# Patient Record
Sex: Female | Born: 1965 | Race: Black or African American | Hispanic: No | Smoking: Former smoker
Health system: Southern US, Community
[De-identification: ages and names within clinical notes are randomized; demographics above are authoritative.]

## PROBLEM LIST (undated history)

## (undated) DIAGNOSIS — M545 Low back pain, unspecified: Secondary | ICD-10-CM

## (undated) DIAGNOSIS — G4730 Sleep apnea, unspecified: Secondary | ICD-10-CM

## (undated) DIAGNOSIS — M25512 Pain in left shoulder: Secondary | ICD-10-CM

## (undated) DIAGNOSIS — K829 Disease of gallbladder, unspecified: Secondary | ICD-10-CM

## (undated) DIAGNOSIS — D649 Anemia, unspecified: Secondary | ICD-10-CM

## (undated) DIAGNOSIS — I10 Essential (primary) hypertension: Secondary | ICD-10-CM

## (undated) DIAGNOSIS — K5900 Constipation, unspecified: Secondary | ICD-10-CM

## (undated) DIAGNOSIS — Z8719 Personal history of other diseases of the digestive system: Secondary | ICD-10-CM

## (undated) HISTORY — DX: Anemia, unspecified: D64.9

## (undated) HISTORY — DX: Low back pain, unspecified: M54.50

## (undated) HISTORY — DX: Constipation, unspecified: K59.00

## (undated) HISTORY — PX: PANCREAS SURGERY: SHX731

## (undated) HISTORY — DX: Personal history of other diseases of the digestive system: Z87.19

## (undated) HISTORY — DX: Pain in left shoulder: M25.512

## (undated) HISTORY — DX: Low back pain: M54.5

## (undated) HISTORY — DX: Disease of gallbladder, unspecified: K82.9

## (undated) HISTORY — PX: CHOLECYSTECTOMY: SHX55

## (undated) HISTORY — PX: ABDOMINAL HYSTERECTOMY: SHX81

---

## 2010-08-01 ENCOUNTER — Emergency Department (HOSPITAL_COMMUNITY): Payer: Self-pay

## 2010-08-01 ENCOUNTER — Encounter (HOSPITAL_COMMUNITY): Payer: Self-pay

## 2010-08-01 ENCOUNTER — Inpatient Hospital Stay (HOSPITAL_COMMUNITY)
Admission: EM | Admit: 2010-08-01 | Discharge: 2010-08-06 | DRG: 438 | Disposition: A | Discharge status: Home / Self Care | Payer: Self-pay | Attending: Internal Medicine | Admitting: Internal Medicine

## 2010-08-01 DIAGNOSIS — K859 Acute pancreatitis without necrosis or infection, unspecified: Principal | ICD-10-CM | POA: Diagnosis present

## 2010-08-01 DIAGNOSIS — K8050 Calculus of bile duct without cholangitis or cholecystitis without obstruction: Secondary | ICD-10-CM | POA: Diagnosis present

## 2010-08-01 DIAGNOSIS — E860 Dehydration: Secondary | ICD-10-CM | POA: Diagnosis present

## 2010-08-01 DIAGNOSIS — J189 Pneumonia, unspecified organism: Secondary | ICD-10-CM | POA: Diagnosis present

## 2010-08-01 DIAGNOSIS — Z9079 Acquired absence of other genital organ(s): Secondary | ICD-10-CM

## 2010-08-01 DIAGNOSIS — Z6841 Body Mass Index (BMI) 40.0 and over, adult: Secondary | ICD-10-CM

## 2010-08-01 DIAGNOSIS — I10 Essential (primary) hypertension: Secondary | ICD-10-CM | POA: Diagnosis present

## 2010-08-01 DIAGNOSIS — Z79899 Other long term (current) drug therapy: Secondary | ICD-10-CM

## 2010-08-01 DIAGNOSIS — Z9089 Acquired absence of other organs: Secondary | ICD-10-CM

## 2010-08-01 HISTORY — DX: Essential (primary) hypertension: I10

## 2010-08-01 LAB — URINALYSIS, ROUTINE W REFLEX MICROSCOPIC
Glucose, UA: NEGATIVE mg/dL
Specific Gravity, Urine: 1.01 (ref 1.005–1.030)
pH: 6 (ref 5.0–8.0)

## 2010-08-01 LAB — COMPREHENSIVE METABOLIC PANEL
AST: 255 U/L — ABNORMAL HIGH (ref 0–37)
AST: 253 U/L — ABNORMAL HIGH (ref 0–37)
Albumin: 4 g/dL (ref 3.5–5.2)
Albumin: 3.9 g/dL (ref 3.5–5.2)
Alkaline Phosphatase: 407 U/L — ABNORMAL HIGH (ref 39–117)
BUN: 13 mg/dL (ref 6–23)
BUN: 13 mg/dL (ref 6–23)
Chloride: 101 mEq/L (ref 96–112)
Creatinine, Ser: 1.01 mg/dL (ref 0.50–1.10)
Potassium: 3 mEq/L — ABNORMAL LOW (ref 3.5–5.1)
Potassium: 3.2 mEq/L — ABNORMAL LOW (ref 3.5–5.1)
Sodium: 140 mEq/L (ref 135–145)
Total Protein: 8.3 g/dL (ref 6.0–8.3)
Total Protein: 8.2 g/dL (ref 6.0–8.3)

## 2010-08-01 LAB — DIFFERENTIAL
Eosinophils Absolute: 0 10*3/uL (ref 0.0–0.7)
Lymphs Abs: 1.7 10*3/uL (ref 0.7–4.0)
Monocytes Absolute: 0.9 10*3/uL (ref 0.1–1.0)
Monocytes Relative: 7 % (ref 3–12)
Neutrophils Relative %: 80 % — ABNORMAL HIGH (ref 43–77)

## 2010-08-01 LAB — CK TOTAL AND CKMB (NOT AT ARMC)
CK, MB: 2.6 ng/mL (ref 0.3–4.0)
Relative Index: 2 (ref 0.0–2.5)

## 2010-08-01 LAB — CBC
HCT: 42.6 % (ref 36.0–46.0)
MCHC: 31.5 g/dL (ref 30.0–36.0)
Platelets: 209 10*3/uL (ref 150–400)
RDW: 14.4 % (ref 11.5–15.5)
WBC: 12.8 10*3/uL — ABNORMAL HIGH (ref 4.0–10.5)

## 2010-08-01 LAB — PROTIME-INR
INR: 0.92 (ref 0.00–1.49)
Prothrombin Time: 12.6 seconds (ref 11.6–15.2)

## 2010-08-01 LAB — URINE MICROSCOPIC-ADD ON

## 2010-08-01 LAB — LIPASE, BLOOD: Lipase: >3000 U/L — ABNORMAL HIGH (ref 11–59)

## 2010-08-01 LAB — TROPONIN I: Troponin I: <0.3 ng/mL (ref <0.30)

## 2010-08-01 LAB — CARDIAC PANEL(CRET KIN+CKTOT+MB+TROPI): Troponin I: <0.3 ng/mL (ref <0.30)

## 2010-08-01 MED ORDER — IOHEXOL 350 MG/ML SOLN
125.0000 mL | Freq: Once | INTRAVENOUS | Status: AC | PRN
  Administered 2010-08-01: 125 mL via INTRAVENOUS

## 2010-08-01 MED ORDER — ONDANSETRON HCL 4 MG/2ML IJ SOLN
INTRAMUSCULAR | Status: AC
  Filled 2010-08-01: qty 2

## 2010-08-01 MED ORDER — GI COCKTAIL ~~LOC~~
ORAL | Status: AC
  Filled 2010-08-01: qty 30

## 2010-08-02 ENCOUNTER — Inpatient Hospital Stay (HOSPITAL_COMMUNITY): Payer: Self-pay

## 2010-08-02 DIAGNOSIS — K859 Acute pancreatitis without necrosis or infection, unspecified: Secondary | ICD-10-CM

## 2010-08-02 DIAGNOSIS — K8050 Calculus of bile duct without cholangitis or cholecystitis without obstruction: Secondary | ICD-10-CM

## 2010-08-02 LAB — GLUCOSE, CAPILLARY
Glucose-Capillary: 78 mg/dL (ref 70–99)
Glucose-Capillary: 94 mg/dL (ref 70–99)
Glucose-Capillary: 113 mg/dL — ABNORMAL HIGH (ref 70–99)

## 2010-08-02 LAB — LIPID PANEL
HDL: 55 mg/dL (ref >39)
LDL Cholesterol: 105 mg/dL — ABNORMAL HIGH (ref 0–99)
Total CHOL/HDL Ratio: 3.1 RATIO
Triglycerides: 56 mg/dL (ref <150)
VLDL: 11 mg/dL (ref 0–40)

## 2010-08-02 LAB — HEPATIC FUNCTION PANEL
AST: 219 U/L — ABNORMAL HIGH (ref 0–37)
Albumin: 3.3 g/dL — ABNORMAL LOW (ref 3.5–5.2)
Alkaline Phosphatase: 397 U/L — ABNORMAL HIGH (ref 39–117)
Total Protein: 7.4 g/dL (ref 6.0–8.3)

## 2010-08-02 LAB — CBC
HCT: 39.8 % (ref 36.0–46.0)
MCHC: 31.2 g/dL (ref 30.0–36.0)
RDW: 14.7 % (ref 11.5–15.5)

## 2010-08-02 LAB — PTH, INTACT AND CALCIUM: Calcium, Total (PTH): 9.1 mg/dL (ref 8.4–10.5)

## 2010-08-02 LAB — DIFFERENTIAL
Basophils Absolute: 0 10*3/uL (ref 0.0–0.1)
Eosinophils Relative: 0 % (ref 0–5)
Lymphocytes Relative: 22 % (ref 12–46)
Monocytes Absolute: 0.6 10*3/uL (ref 0.1–1.0)

## 2010-08-02 LAB — BASIC METABOLIC PANEL
BUN: 13 mg/dL (ref 6–23)
CO2: 31 mEq/L (ref 19–32)
Glucose, Bld: 102 mg/dL — ABNORMAL HIGH (ref 70–99)
Potassium: 3.7 mEq/L (ref 3.5–5.1)
Sodium: 141 mEq/L (ref 135–145)

## 2010-08-02 LAB — CARDIAC PANEL(CRET KIN+CKTOT+MB+TROPI)
Relative Index: 1.4 (ref 0.0–2.5)
Total CK: 267 U/L — ABNORMAL HIGH (ref 7–177)
Troponin I: <0.3 ng/mL (ref <0.30)

## 2010-08-02 LAB — URINE CULTURE: Culture  Setup Time: 201208010127

## 2010-08-02 LAB — LIPASE, BLOOD: Lipase: 945 U/L — ABNORMAL HIGH (ref 11–59)

## 2010-08-02 MED ORDER — GADOBENATE DIMEGLUMINE 529 MG/ML IV SOLN
20.0000 mL | Freq: Once | INTRAVENOUS | Status: AC | PRN
  Administered 2010-08-02: 20 mL via INTRAVENOUS

## 2010-08-03 DIAGNOSIS — K8050 Calculus of bile duct without cholangitis or cholecystitis without obstruction: Secondary | ICD-10-CM

## 2010-08-03 DIAGNOSIS — K859 Acute pancreatitis without necrosis or infection, unspecified: Secondary | ICD-10-CM

## 2010-08-03 LAB — DIFFERENTIAL
Eosinophils Absolute: 0 10*3/uL (ref 0.0–0.7)
Eosinophils Relative: 0 % (ref 0–5)
Lymphs Abs: 2.1 10*3/uL (ref 0.7–4.0)

## 2010-08-03 LAB — GLUCOSE, CAPILLARY
Glucose-Capillary: 100 mg/dL — ABNORMAL HIGH (ref 70–99)
Glucose-Capillary: 84 mg/dL (ref 70–99)
Glucose-Capillary: 90 mg/dL (ref 70–99)

## 2010-08-03 LAB — BASIC METABOLIC PANEL
BUN: 9 mg/dL (ref 6–23)
Chloride: 102 mEq/L (ref 96–112)
GFR calc Af Amer: >60 mL/min (ref >60)
Potassium: 4.1 mEq/L (ref 3.5–5.1)

## 2010-08-03 LAB — CBC
MCV: 77.3 fL — ABNORMAL LOW (ref 78.0–100.0)
Platelets: 153 10*3/uL (ref 150–400)
RDW: 15 % (ref 11.5–15.5)
WBC: 10.7 10*3/uL — ABNORMAL HIGH (ref 4.0–10.5)

## 2010-08-03 LAB — HEPATIC FUNCTION PANEL
AST: 78 U/L — ABNORMAL HIGH (ref 0–37)
Bilirubin, Direct: 0.7 mg/dL — ABNORMAL HIGH (ref 0.0–0.3)
Indirect Bilirubin: 1.4 mg/dL — ABNORMAL HIGH (ref 0.3–0.9)

## 2010-08-03 LAB — MAGNESIUM: Magnesium: 2.1 mg/dL (ref 1.5–2.5)

## 2010-08-03 LAB — PHOSPHORUS: Phosphorus: 3.1 mg/dL (ref 2.3–4.6)

## 2010-08-04 ENCOUNTER — Inpatient Hospital Stay (HOSPITAL_COMMUNITY): Payer: Self-pay

## 2010-08-04 LAB — HEPATIC FUNCTION PANEL
ALT: 146 U/L — ABNORMAL HIGH (ref 0–35)
AST: 31 U/L (ref 0–37)
Bilirubin, Direct: 0.3 mg/dL (ref 0.0–0.3)
Indirect Bilirubin: 0.8 mg/dL (ref 0.3–0.9)
Total Bilirubin: 1.1 mg/dL (ref 0.3–1.2)

## 2010-08-04 LAB — GLUCOSE, CAPILLARY: Glucose-Capillary: 90 mg/dL (ref 70–99)

## 2010-08-05 LAB — COMPREHENSIVE METABOLIC PANEL
ALT: 112 U/L — ABNORMAL HIGH (ref 0–35)
Alkaline Phosphatase: 437 U/L — ABNORMAL HIGH (ref 39–117)
CO2: 30 mEq/L (ref 19–32)
Chloride: 103 mEq/L (ref 96–112)
GFR calc Af Amer: >60 mL/min (ref >60)
Glucose, Bld: 91 mg/dL (ref 70–99)
Potassium: 3.7 mEq/L (ref 3.5–5.1)
Sodium: 139 mEq/L (ref 135–145)
Total Bilirubin: 1.5 mg/dL — ABNORMAL HIGH (ref 0.3–1.2)
Total Protein: 6.9 g/dL (ref 6.0–8.3)

## 2010-08-05 LAB — CBC
Hemoglobin: 11.6 g/dL — ABNORMAL LOW (ref 12.0–15.0)
RBC: 4.75 MIL/uL (ref 3.87–5.11)
WBC: 8 10*3/uL (ref 4.0–10.5)

## 2010-08-06 DIAGNOSIS — K8050 Calculus of bile duct without cholangitis or cholecystitis without obstruction: Secondary | ICD-10-CM

## 2010-08-06 DIAGNOSIS — K859 Acute pancreatitis without necrosis or infection, unspecified: Secondary | ICD-10-CM

## 2010-08-06 LAB — HEPATIC FUNCTION PANEL
AST: 41 U/L — ABNORMAL HIGH (ref 0–37)
Bilirubin, Direct: 0.2 mg/dL (ref 0.0–0.3)
Total Bilirubin: 0.6 mg/dL (ref 0.3–1.2)

## 2010-08-16 NOTE — Discharge Summary (Signed)
[name], [name] NO.:  [id number]  MEDICAL RECORD NO.:  [id number]  LOCATION:  1419                         FACILITY:  [facility]  PHYSICIAN:  Jeoffrey Massed, MD    DATE OF BIRTH:  [birth year]  DATE OF ADMISSION:  08/01/2010 DATE OF DISCHARGE:                        DISCHARGE SUMMARY - REFERRING   PRIMARY CARE PRACTITIONER:  Newman Pies, MD at [facility].  PRIMARY DISCHARGE DIAGNOSES: 1. Gallstone pancreatitis. 2. Possible pneumonia. 3. Choledocholithiasis status post ERCP.  SECONDARY DISCHARGE DIAGNOSES/PAST MEDICAL HISTORY:  Significant for; 1. Hypertension. 2. History of urgent cholecystectomy around in 2002 while the patient     was in [place].  DISCHARGE MEDICATIONS:  Include the following; 1. Milk of magnesia 30 mL p.o. daily p.r.n. 2. Zofran 4 mg p.o. 6 hours p.r.n. 3. Oxycodone 5 mg 1 tablet q.6 h. p.r.n. for intractable pain. 4. Protonix 40 mg p.o. daily. 5. Lisinopril/hydrochlorothiazide 20/25 one tablet p.o. daily.  CONSULTANTS ON THE CASE:  Iva Boop, MD, [facility].  BRIEF HISTORY OF PRESENT ILLNESS:  The patient is a 45 year old female who has a history of hypertension and who underwent a urgent cholecystectomy in 2002, presented with abdominal pain.  She was found to have elevated lipase and elevated liver function tests and was then admitted to the hospitalist service for the evaluation and treatment. For further details, please see the history and physical that was dictated by Dr. Toniann Fail on admission.  PERTINENT RADIOLOGICAL STUDIES: 1. CT angiogram of the chest showed no evidence of thoracic aortic     dissection.  Mild cardiomegaly.  Bilateral lower lobe infiltrate     suspicious for pneumonia. 2. CTA of the abdomen and pelvis showed no evidence for abdominal     aortic iliac artery dissection.  Mild-to-moderate acute     pancreatitis.  No evidence of pancreatic mass or diverticulosis. 3. MRCP of the  abdomen showed mild intra and extrahepatic biliary duct     dilatation with an apparent 8 x 12 mm distal common duct stone,     just about at the level of the ampulla.  Edema in and around the     pancreatic head and body.  Imaging features are consistent with     pancreatitis.  There is no evidence of pseudocyst or abscess at     this point in time.  BRIEF HOSPITAL COURSE: 1. Pancreatitis.  The patient had clinical as well as laboratory     evidence of pancreatitis.  On initial presentation, her liver     function tests were grossly abnormal, which included an elevated     alkaline phosphatase along with elevated bilirubin levels.  Her     transaminases were also elevated.  Given her clinical features, it     was highly suspicious that she did in fact have choledocholithiasis     that was causing her to have pancreatitis.  MRCP was done, which     did confirm intrahepatic and extrahepatic biliary ductal dilatation     and also showed a stone that was lodged in the CBD.  Please see     above for further details.  GI was then consulted and the  patient     underwent an ERCP with sphincterotomy and removal of stones done on     August 3rd.  Following this, her pain has significantly resolved.     Patient is now able to eat a regular diet.  GI has seen her and     cleared her for discharge as well.  They recommended checking a     repeat LFT in around 1 week time with her primary care     practitioner, all of this was explained to the patient in detail by     me personally.  She is to follow up with [facility] as needed.  Dr.     Leone Payor has left the patient, his contact information as well. 2. Hypertension.  The patient, initially on first few days of     admission, did not even require any antihypertensive medication as     the blood pressure was controlled on own; however, with symptomatic     improvement, her blood pressure slowly started to creep up and     today it has been in the  170s to 180s range.  She has received all     of her usual blood pressure medications and now her last BP was     154/95.  The patient did have some mild headaches early; however,     that has also resolved.  DISPOSITION:  It was thought that the patient is stable for discharge home.  FOLLOWUP INSTRUCTIONS:  The patient to follow up with her primary care practitioner within 1 week for repeat LFTs.  She is to call and make an appointment.  She claims understanding.  She is to follow up with Dr. Stan Head of [facility] as needed.  DIET:  The patient should stay on a low-fat diet for the next few weeks as well.  Total time spent for discharge 45 minutes.     Jeoffrey Massed, MD     SG/MEDQ  D:  08/06/2010  T:  08/06/2010  Job:  [id number]  cc:   Newman Pies, MD Fax: [phone]  Electronically Signed by Jeoffrey Massed  on 08/16/2010 05:33:40 PM

## 2010-09-05 NOTE — H&P (Signed)
NAME:  [name], RAHE NO.:  [id number]  MEDICAL RECORD NO.:  [id number]  LOCATION:  WLED                         FACILITY:  [facility]  PHYSICIAN:  Eduard Clos, MDDATE OF BIRTH:  1965-02-17  DATE OF ADMISSION:  08/01/2010 DATE OF DISCHARGE:                             HISTORY & PHYSICAL   PRIMARY CARE PHYSICIAN:  Newman Pies, MD, [facility]  CHIEF COMPLAINT:  Abdominal pain.  HISTORY OF PRESENT ILLNESS:  45 year old female with history of hypertension who has been experiencing sudden onset of abdominal pain since morning today after which the patient developed multiple episodes of nausea, vomiting, and also eventually had a near-syncopal like spell and had multiple episodes of diarrhea.  Denies any blood in the vomiting or in the diarrhea.  The patient had a persistent pain.  The pain was mainly in the epigastric area which was radiating to her back.  The patient was brought to the ER.  In the ER, the patient had a CT chest, abdomen and pelvis to rule out any dissection.  It was showing acute pancreatitis.  In addition, the patient's labs also showed increased LFTs with increased lipase.  At this time, the patient has been admitted for acute pancreatitis.  The patient states that along with the abdominal pain, the patient also developed some mild shortness of breath with chest pain, which at this time has resolved.  Denies any headache or visual symptoms, any focal deficit.  Denies any dysuria or discharges.  PAST MEDICAL HISTORY:  History of hypertension.  PAST SURGICAL HISTORY:  Cholecystectomy and hysterectomy.  MEDICATIONS PRIOR TO ADMISSION:  Lisinopril with hydrochlorothiazide 20/25 mg p.o. daily.  ALLERGIES:  No known drug allergies.  FAMILY HISTORY:  Positive for grandmother having lung cancer.  SOCIAL HISTORY:  The patient quit smoking in 1999.  Also quit drinking in 1999; used to drink very occasionally.  Denies any drug abuse.   Lives with her mom.  Works for the city of [place].  She is in the [employer].  REVIEW OF SYSTEMS:  As per the history of present illness, nothing else is significant.  PHYSICAL EXAMINATION:  GENERAL:  The patient examined bedside, not in acute distress. VITAL SIGNS:  Blood pressure 122/60, pulse is 58 per minute, temperature 97.7, respirations 20 per minute, O2 sat is 96% on 2 L.  HEENT: Anicteric.  No pallor.  No discharge from ears, eyes, nose or mouth. CHEST:  Bilateral air entry present.  No rhonchi.  No crepitation. HEART:  S1 and S2 heard. ABDOMEN:  Soft, nontender.  Bowel sounds heard.  No discoloration.  No guarding or rigidity. NEUROLOGIC:  The patient is alert, awake and oriented to time, place and person. EXTREMITIES:  Both upper and lower extremities 5/5.  Peripheral pulses felt.  No edema.  IMAGING STUDIES: 1. Chest x-ray shows low lung volumes with patchy coarse bilateral     lower lobe airspace opacities. 2. CT angiography of the chest shows no evidence of thoracic aortic     dissection.  Bilateral lower lobe infiltrates suspicious for     pneumonia.  Mild cardiomegaly. 3. CT of the abdomen and pelvis, angio protocol, shows no evidence of  abdominal aortic or iliac artery dissection, mild-to-moderate acute     pancreatitis.  No evidence of pancreatic mass or pseudocyst.     Diverticulosis; no radiographic evidence of diverticulitis.  Small     periumbilical hernia containing only fat.  LABORATORY DATA:  CBC:  WBC is 12.8, hemoglobin is 13.4, hematocrit is 42.6, platelets 209.  PT/INR is 12.6 and 0.92, D-dimer is 0.7.  Complete metabolic panel:  Sodium 139, potassium 3, chloride 100, carbon dioxide 31, glucose 109, BUN 13, creatinine 1.  Total bilirubin 1.8, alkaline phosphatase 398, AST 253, ALT 427, total protein 8.2, albumin 3.9, calcium 10.3, lipase more than 3000.  CK 131, MB is 2.6, relative index 2, troponin less than 0.3.  UA is negative for  nitrites, trace leukocytes, some rare WBCs 3-6, bacteria few.  ASSESSMENT: 1. Acute pancreatitis. 2. Possible pneumonia. 3. Dehydration. 4. Mild hypercalcemia. 5. History of hypertension. 6. Abnormal liver function tests.  PLAN: 1. At this time, we will admit the patient to telemetry. 2. For her acute pancreatitis, at this time I am going to keep the     patient n.p.o., hydrate the patient with normal saline, and pain     relief medications.  Repeat her labs again in the a.m. including     BMET, LFTs, CBC and lipase levels.  At this time, we do not know     the exact cause for her pancreatitis.  The patient did have a     previous cholecystectomy but her LFTs are slightly high at this     time including the alkaline phosphatase.  I am going to do an MRCP     to make sure there is no definite obstruction.  CAT scan does not     show anything at this time.  We should closely follow the LFTs.  I     am also going to check a fasting lipid panel to particularly look     into triglyceride level.  The patient only occasionally drinks     alcohol and she states she has not drunk for a long time now. 3. Bilateral air space disease per the CT angio chest.  At this time,     the patient does not have any cough or phlegm but she did throw up     multiple times.  At this time, I will keep the patient empirically     on Levaquin.  I am going to repeat a chest x-ray again in the a.m.     particularly to make sure the patient is not developing ARDS.  The     patient at this time is not in any acute distress.  The patient's     urine also shows possibility of UTI for which I am going to get     urine cultures.  The patient's Levaquin will also cover for     possibility of UTI. 4. Dehydration and hypercalcemia.  I think the patient's hypercalcemia     probably is from her dehydration and also the patient has been     using hydrochlorothiazide.  I think her hypercalcemia will improve     with  hydration and holding off her hydrochlorothiazide.  I am also     going to get levels of serum parathyroid levels, vitamin D and     phosphorus levels. 5. I am also going to check an acute hepatitis panel given her     increased LFTs.  We will also cycle cardiac  markers, get an EKG,     and as advised earlier, repeat a portable chest x-ray in the a.m. 6. Further recommendations as condition evolves.     Eduard Clos, MD     ANK/MEDQ  D:  08/01/2010  T:  08/01/2010  Job:  [id number]  cc:   Newman Pies, MD Fax: [phone]  Electronically Signed by Midge Minium MD on 09/05/2010 09:00:50 AM

## 2012-11-06 ENCOUNTER — Other Ambulatory Visit: Payer: Self-pay | Admitting: Nurse Practitioner

## 2012-11-06 DIAGNOSIS — R1900 Intra-abdominal and pelvic swelling, mass and lump, unspecified site: Secondary | ICD-10-CM

## 2012-11-11 ENCOUNTER — Ambulatory Visit
Admission: RE | Admit: 2012-11-11 | Discharge: 2012-11-11 | Disposition: A | Discharge status: Home / Self Care | Payer: BC Managed Care – PPO | Source: Ambulatory Visit | Attending: Nurse Practitioner | Admitting: Nurse Practitioner

## 2012-11-11 DIAGNOSIS — R1900 Intra-abdominal and pelvic swelling, mass and lump, unspecified site: Secondary | ICD-10-CM

## 2014-02-04 ENCOUNTER — Emergency Department (HOSPITAL_COMMUNITY)
Admission: EM | Admit: 2014-02-04 | Discharge: 2014-02-04 | Disposition: A | Discharge status: Home / Self Care | Payer: Self-pay | Attending: Emergency Medicine | Admitting: Emergency Medicine

## 2014-02-04 ENCOUNTER — Emergency Department (HOSPITAL_COMMUNITY): Payer: Self-pay

## 2014-02-04 ENCOUNTER — Encounter (HOSPITAL_COMMUNITY): Payer: Self-pay | Admitting: Emergency Medicine

## 2014-02-04 DIAGNOSIS — I10 Essential (primary) hypertension: Secondary | ICD-10-CM | POA: Insufficient documentation

## 2014-02-04 DIAGNOSIS — E669 Obesity, unspecified: Secondary | ICD-10-CM | POA: Insufficient documentation

## 2014-02-04 DIAGNOSIS — Y9289 Other specified places as the place of occurrence of the external cause: Secondary | ICD-10-CM | POA: Insufficient documentation

## 2014-02-04 DIAGNOSIS — Y998 Other external cause status: Secondary | ICD-10-CM | POA: Insufficient documentation

## 2014-02-04 DIAGNOSIS — W102XXA Fall (on)(from) incline, initial encounter: Secondary | ICD-10-CM

## 2014-02-04 DIAGNOSIS — S8991XA Unspecified injury of right lower leg, initial encounter: Secondary | ICD-10-CM | POA: Insufficient documentation

## 2014-02-04 DIAGNOSIS — Y9389 Activity, other specified: Secondary | ICD-10-CM | POA: Insufficient documentation

## 2014-02-04 DIAGNOSIS — W010XXA Fall on same level from slipping, tripping and stumbling without subsequent striking against object, initial encounter: Secondary | ICD-10-CM | POA: Insufficient documentation

## 2014-02-04 MED ORDER — HYDROCODONE-ACETAMINOPHEN 5-325 MG PO TABS: 1.0000 | ORAL_TABLET | ORAL | Status: DC | PRN

## 2014-02-04 NOTE — ED Notes (Signed)
Per EMS: Pt tripped off curb at [employer].  Twisted rt knee.  Pt states she braced herself on elbows when she fell.  Rt lateral knee pain.  Pt was able to stand and pivot onto EMS stretcher.  Pulses present.

## 2014-02-04 NOTE — ED Provider Notes (Signed)
CSN: [id number]     Arrival date & time 02/04/14  1246 History  This chart was scribed for non-physician practitioner, Quincy Carnes, PA-C and Verna Czech, PA-student working with Jasper Riling. Alvino Chapel, MD by Einar Pheasant, ED scribe. This patient was seen in room WTR8/WTR8 and the patient's care was started at 12:52 PM.    Chief Complaint  Patient presents with  . Fall  . Knee Pain   The history is provided by the patient and medical records. No language interpreter was used.   HPI Comments: [name] is a 49 y.o. Female with a PMhx of HTN was brought in by ambulance, she presents to the Emergency Department complaining of a Fall that occurred just prior to arrival, approximately 1 hours ago. Pt states that she was walking out of [employer] when she missed a step and felt her right knee pop. She does admit twisting her right ankle but she states that the pain is localized more to her knee. Pain made worse by bearing weight and movement. She currently rates her pain as "7/10". No hx of prior knee problems. Denies tobacco usage or illicit drug use. Pt denies fever, neck pain, sore throat, visual disturbance, CP, cough, SOB, abdominal pain, nausea, emesis, diarrhea, urinary symptoms, back pain, HA, weakness, numbness and rash as associated symptoms.    Past Medical History  Diagnosis Date  . Hypertension    No past surgical history on file. No family history on file. History  Substance Use Topics  . Smoking status: Not on file  . Smokeless tobacco: Not on file  . Alcohol Use: Not on file   OB History    No data available     Review of Systems  Constitutional: Negative for fever.  Musculoskeletal: Positive for joint swelling and arthralgias. Negative for myalgias.  Neurological: Negative for weakness and numbness.   Allergies  Contrast media  Home Medications   Prior to Admission medications   Not on File   BP 131/78 mmHg  Pulse 75  Temp(Src) 98.1 F (36.7 C) (Oral)  Resp  18  SpO2 99%   Physical Exam  Constitutional: She is oriented to person, place, and time. She appears well-developed and well-nourished.  HENT:  Head: Normocephalic and atraumatic.  Mouth/Throat: Oropharynx is clear and moist.  Eyes: Conjunctivae and EOM are normal. Pupils are equal, round, and reactive to light.  Neck: Normal range of motion.  Cardiovascular: Normal rate, regular rhythm and normal heart sounds.   Pulmonary/Chest: Effort normal and breath sounds normal.  Abdominal: Soft. Bowel sounds are normal.  Musculoskeletal:       Right knee: She exhibits decreased range of motion and swelling (mild). Tenderness found. Lateral joint line tenderness noted.  Anterior and posterior drawer test negative. Positive Valgus. Negative Varus.  Leg remains NVI  Neurological: She is alert and oriented to person, place, and time.  Skin: Skin is warm and dry.  Psychiatric: She has a normal mood and affect.  Nursing note and vitals reviewed.   ED Course  Procedures (including critical care time)  DIAGNOSTIC STUDIES: Oxygen Saturation is 99% on RA, normal by my interpretation.    COORDINATION OF CARE: 1:10 PM- Pt advised of plan for treatment and pt agrees.  Imaging Review Dg Knee Complete 4 Views Right  02/04/2014   CLINICAL DATA:  Status post fall today with a right knee injury. The patient heard a pop. Initial encounter.  EXAM: RIGHT KNEE - COMPLETE 4+ VIEW  COMPARISON:  None.  FINDINGS: No acute bony or joint abnormality. No acute bony or joint abnormality is identified. Joint spaces are preserved with an osteophyte seen off the superior pole of the patella. Mild spurring at the quadriceps tendon insertion is noted.  IMPRESSION: Negative exam.   Electronically Signed   By: Inge Rise M.D.   On: 02/04/2014 14:14   MDM   Final diagnoses:  Right knee injury, initial encounter  Fall (on)(from) incline, initial encounter   49 year old female with trip and fall off curb it is obese.  She twisted her right knee. No head injury or loss of consciousness. She complains of lateral right knee pain. She has been able to bear weight and perform limited movements.  X-ray was obtained which is negative for acute findings. Patient does have positive valgus test and complains of pain "in the inside of her knee". Concern for possible internal derangement of knee. Patient placed in knee immobilizer and crutches given. She was started on Vicodin for pain control. She will follow-up with orthopedics.  I personally performed the services described in this documentation, which was scribed in my presence. The recorded information has been reviewed and is accurate.  Larene Pickett, PA-C 02/04/14 [place] Alvino Chapel, MD 02/08/14 [phone]

## 2014-02-04 NOTE — Discharge Instructions (Signed)
Take the prescribed medication as directed. Follow-up with Dr. Mardelle Matte for further evaluation of knee injury-- call to schedule appt. Return to the ED for new or worsening symptoms.

## 2014-02-04 NOTE — ED Notes (Signed)
Pt stated that she stepped off a curb, twisting r/leg. C/o r/knee pain. Pt did not strike head

## 2016-01-26 DIAGNOSIS — I10 Essential (primary) hypertension: Secondary | ICD-10-CM | POA: Diagnosis not present

## 2016-01-26 DIAGNOSIS — J019 Acute sinusitis, unspecified: Secondary | ICD-10-CM | POA: Diagnosis not present

## 2016-01-26 DIAGNOSIS — J302 Other seasonal allergic rhinitis: Secondary | ICD-10-CM | POA: Diagnosis not present

## 2016-06-26 DIAGNOSIS — I10 Essential (primary) hypertension: Secondary | ICD-10-CM | POA: Diagnosis not present

## 2016-06-26 DIAGNOSIS — J301 Allergic rhinitis due to pollen: Secondary | ICD-10-CM | POA: Diagnosis not present

## 2016-07-09 DIAGNOSIS — Z1231 Encounter for screening mammogram for malignant neoplasm of breast: Secondary | ICD-10-CM | POA: Diagnosis not present

## 2016-07-10 DIAGNOSIS — J301 Allergic rhinitis due to pollen: Secondary | ICD-10-CM | POA: Diagnosis not present

## 2016-07-10 DIAGNOSIS — I10 Essential (primary) hypertension: Secondary | ICD-10-CM | POA: Diagnosis not present

## 2016-08-09 DIAGNOSIS — I10 Essential (primary) hypertension: Secondary | ICD-10-CM | POA: Diagnosis not present

## 2016-08-21 DIAGNOSIS — H40033 Anatomical narrow angle, bilateral: Secondary | ICD-10-CM | POA: Diagnosis not present

## 2016-09-20 DIAGNOSIS — Z1211 Encounter for screening for malignant neoplasm of colon: Secondary | ICD-10-CM | POA: Diagnosis not present

## 2016-09-21 ENCOUNTER — Encounter: Payer: Self-pay | Admitting: Registered"

## 2016-09-21 ENCOUNTER — Encounter: Payer: Self-pay | Attending: Family Medicine | Admitting: Registered"

## 2016-09-21 DIAGNOSIS — Z713 Dietary counseling and surveillance: Secondary | ICD-10-CM | POA: Insufficient documentation

## 2016-09-21 DIAGNOSIS — I10 Essential (primary) hypertension: Secondary | ICD-10-CM | POA: Insufficient documentation

## 2016-09-21 NOTE — Progress Notes (Signed)
Medical Nutrition Therapy:  Appt start time: 0925 end time:  1025.   Assessment:  Primary concerns today: Pt referred for weight management and hypertension. Pt says she wants to lose weight. She says she would like to get under 200 lb. Pt says she has lost 22 lb on the Keto diet over the past two months. Pt says she would also like to be able to get her hypertension under control so she would not have to take medication for it, but her doctor told her it is unlikely she would be able to discontinue medications as HTN runs in her family. Pt is currently only eating meats, vegetables, and recently added in fruit per recommendations from her doctor. Pt says she misses being able to eat potatoes and sub sandwiches, but she does like that she is now eating less processed foods. Pt works for the [employer]. Pt says she struggles with getting in physical activity because by the time she gets home from work she does not feel like exercising. 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   Noted labs:   06/26/16:  HbA1c: 5.8 HDL Cholesterol: 47 mg/dL  Preferred Learning Style:  No preference indicated   Learning Readiness:   Ready  MEDICATIONS: See list.    DIETARY INTAKE:  Usual eating pattern includes 3 meals and 1-2 snacks per day. Pt says she often will eat either nuts or a low carbohydrate ice cream as a snack. Meals at home are often eaten in her bedroom with electronics present.   Everyday foods include eggs, salad, smoothies. Avoided foods include pork. Pt currently only eats meats, vegetables, and fruits.     24-hr recall:  B ( AM): coffee with flavored creamer, 3 eggs with cheese, [place] bacon   Snk ( AM): None reported.  L ( PM): smoothie with spinach, avocado, strawberries, blueberries, and watermelon Snk ( PM): honey glazed almonds  D ( PM): chicken fajita with onions, peppers, salsa, cheese, lettuce, guacamole, and sour  cream, low carb ice cream dessert ([employer] Smart Carb ice cream bar)  Snk ( PM): None reported.  Beverages: coffee, water   Usual physical activity: None. Has a gym membership but has not went yet due to schedule.   Progress Towards Goal(s):  In progress.   Nutritional Diagnosis:  NI-5.11.1 Predicted suboptimal nutrient intake As related to avoidance of grains/starches .  As evidenced by pt's reported dietary recall and habits (pt reports following keto diet).    Intervention:  Nutrition counseling provided. Dietitian provided education regarding balanced nutrition. Discussed how dieting affects metabolism and how cutting out whole food groups can lead to a nutritional deficiency/is not advised. Encouraged pt to include some whole grains or starchy vegetables at each meal following the My Plate recommendation. Discussed including some protein if having a carbohydrate snack such as fruit. Provided education on mindful eating. Encouraged pt to include regular physical activity. Discussed convenient ways to get in more activity-walking during part of lunch break, parking farther away from building, taking the stairs, etc. Provided low sodium nutrition and nutrition label reading education. Pt appeared agreeable to information/goals discussed.   Goals:   Make sure to get in three meals per day. Try to have balanced meals like the plate example-try to include some whole grains/starch at each meal in moderation (see handout).   Try to be mindful of sodium content of foods and choose foods low in sodium (see  handout). Calorieking.com can be used to view nutrition for foods in restaurants.   https://whatscooking.CustomizedRugs.fi -To help assist with meal planning and recipes   Try to include regular physical activity-try to work toward 150 minutes per week/30 minutes most days of the week.   Try to practice mindful eating at meal times to help you listen to fullness and hunger cues-try to have meals at a  table without electronics present.   Teaching Method Utilized:  Visual Auditory  Handouts given during visit include:  Balanced plate with food list   Low Sodium Nutrition Therapy   How to Read a Food Label   Carbohydrate/protein snack combinations   Barriers to learning/adherence to lifestyle change: None indicated.   Demonstrated degree of understanding via:  Teach Back   Monitoring/Evaluation:  Dietary intake, exercise,  and body weight prn.

## 2016-09-21 NOTE — Patient Instructions (Signed)
Make sure to get in three meals per day. Try to have balanced meals like the plate example-try to include some whole grains/starch at each meal in moderation (see handout).   Try to be mindful of sodium content of foods and choose foods low in sodium (see handout). Calorieking.com can be used to view nutrition for foods in restaurants.   https://whatscooking.CustomizedRugs.fi -To help assist with meal planning and recipes   Try to include regular physical activity-try to work toward 150 minutes per week/30 minutes most days of the week.   Try to practice mindful eating at meal times to help you listen to fullness and hunger cues-try to have meals at a table without electronics present.

## 2016-10-09 DIAGNOSIS — I10 Essential (primary) hypertension: Secondary | ICD-10-CM | POA: Diagnosis not present

## 2016-10-09 DIAGNOSIS — R7303 Prediabetes: Secondary | ICD-10-CM | POA: Diagnosis not present

## 2016-10-09 DIAGNOSIS — E782 Mixed hyperlipidemia: Secondary | ICD-10-CM | POA: Diagnosis not present

## 2016-10-10 DIAGNOSIS — K5730 Diverticulosis of large intestine without perforation or abscess without bleeding: Secondary | ICD-10-CM | POA: Diagnosis not present

## 2016-10-10 DIAGNOSIS — K635 Polyp of colon: Secondary | ICD-10-CM | POA: Diagnosis not present

## 2016-10-10 DIAGNOSIS — Z1211 Encounter for screening for malignant neoplasm of colon: Secondary | ICD-10-CM | POA: Diagnosis not present

## 2016-10-10 DIAGNOSIS — D122 Benign neoplasm of ascending colon: Secondary | ICD-10-CM | POA: Diagnosis not present

## 2017-01-09 DIAGNOSIS — M7542 Impingement syndrome of left shoulder: Secondary | ICD-10-CM | POA: Diagnosis not present

## 2017-01-17 ENCOUNTER — Encounter (INDEPENDENT_AMBULATORY_CARE_PROVIDER_SITE_OTHER): Payer: Self-pay

## 2017-01-23 ENCOUNTER — Encounter (INDEPENDENT_AMBULATORY_CARE_PROVIDER_SITE_OTHER): Payer: Self-pay | Admitting: Family Medicine

## 2017-01-23 ENCOUNTER — Ambulatory Visit (INDEPENDENT_AMBULATORY_CARE_PROVIDER_SITE_OTHER): Payer: 59 | Admitting: Family Medicine

## 2017-01-23 VITALS — BP 148/88 | HR 80 | Ht 63.0 in | Wt 271.0 lb

## 2017-01-23 DIAGNOSIS — R5383 Other fatigue: Secondary | ICD-10-CM

## 2017-01-23 DIAGNOSIS — E66813 Obesity, class 3: Secondary | ICD-10-CM

## 2017-01-23 DIAGNOSIS — Z1331 Encounter for screening for depression: Secondary | ICD-10-CM

## 2017-01-23 DIAGNOSIS — Z9189 Other specified personal risk factors, not elsewhere classified: Secondary | ICD-10-CM

## 2017-01-23 DIAGNOSIS — R0602 Shortness of breath: Secondary | ICD-10-CM | POA: Diagnosis not present

## 2017-01-23 DIAGNOSIS — Z6841 Body Mass Index (BMI) 40.0 and over, adult: Secondary | ICD-10-CM

## 2017-01-23 DIAGNOSIS — I517 Cardiomegaly: Secondary | ICD-10-CM | POA: Diagnosis not present

## 2017-01-23 DIAGNOSIS — I10 Essential (primary) hypertension: Secondary | ICD-10-CM | POA: Diagnosis not present

## 2017-01-23 DIAGNOSIS — Z0289 Encounter for other administrative examinations: Secondary | ICD-10-CM

## 2017-01-23 NOTE — Progress Notes (Signed)
.  Office: [phone]  /  Fax: [phone]   HPI:   Chief Complaint: OBESITY  Jailee Casamento (MR# [id number]) is a 52 y.o. female who presents on 01/23/2017 for obesity evaluation and treatment. Current BMI is Body mass index is 48.01 kg/m.Marland Kitchen Layne has struggled with obesity for years and has been unsuccessful in either losing weight or maintaining long term weight loss. Renarda attended our information session and states she is currently in the action stage of change and ready to dedicate time achieving and maintaining a healthier weight.  Maloni states her family eats meals together she thinks her family will eat healthier with  her her desired weight loss is 100 lbs she has been heavy most of  her life she started gaining weight after college her heaviest weight ever was 270 lbs. she is a picky eater and doesn't like to eat healthier foods  she snacks frequently in the evenings she is frequently drinking liquids with calories she has problems with excessive hunger  she frequently eats larger portions than normal  she has binge eating behaviors she struggles with emotional eating    Fatigue Cameryn has EKG with LVH and feels her energy is lower than it should be. This has worsened with weight gain and has not worsened recently. Liana admits to daytime somnolence and  admits to waking up still tired. Patient is at risk for obstructive sleep apnea. Patent has a history of symptoms of daytime fatigue, morning fatigue, morning headache and hypertension. Patient generally gets 5 or 6 hours of sleep per night, and states they generally have restless sleep. Snoring is present. Apneic episodes are not present. Epworth Sleepiness Score is 4   Dyspnea on exertion Teea notes increasing shortness of breath with exercising and seems to be worsening over time with weight gain. She notes getting out of breath sooner with activity than she used to. This has not gotten  worse recently. She shows no signs of fluid overload and she has no history of asthma. Ayeisha denies orthopnea.  Hypertension Dvora Masin is a 52 y.o. female with hypertension and EKG with LVH. Her blood pressure is slightly elevated today at [place] denies chest pain, chest pressure or headache. She is attempting to work on weight loss to help control her blood pressure with the goal of decreasing her risk of heart attack and stroke. Antwoinettes blood pressure is not currently controlled.   Left Ventricular Hypertrophy with history of phentermine EKG with LVH There is no documented history of LVH. Annahi admits shortness of breath and fatigue with exertion. She denies chest pain, syncope, dizziness or palpitations.  At risk for cardiovascular disease Dymin is at a higher than average risk for cardiovascular disease due to obesity, hypertension and LVH. She currently denies any chest pain.  Depression Screen Chaeli's Food and Mood (modified PHQ-9) score was  Depression screen PHQ 2/9 01/23/2017  Decreased Interest 2  Down, Depressed, Hopeless 0  PHQ - 2 Score 2  Altered sleeping 1  Tired, decreased energy 3  Change in appetite 1  Feeling bad or failure about yourself  1  Trouble concentrating 0  Moving slowly or fidgety/restless 0  Suicidal thoughts 0  PHQ-9 Score 8  Difficult doing work/chores Not difficult at all    ALLERGIES: Allergies  Allergen Reactions  . Contrast Media [Iodinated Diagnostic Agents] Nausea And Vomiting    Pt began vomiting after multihance injection     MEDICATIONS: Current Outpatient Medications on File Prior to Visit  Medication Sig Dispense Refill  . AMLODIPINE BESYLATE PO Take by mouth.    . Ascorbic Acid (VITAMIN C PO) Take by mouth.    . Azilsartan-Chlorthalidone (EDARBYCLOR PO) Take by mouth.    . Calcium 500-100 MG-UNIT CHEW Chew 1 tablet by mouth 2 (two) times daily.    . meloxicam (MOBIC) 15 MG tablet  Take 15 mg by mouth daily as needed for pain.    . Multiple Vitamin (MULTIVITAMIN WITH MINERALS) TABS tablet Take 1 tablet by mouth daily.     No current facility-administered medications on file prior to visit.     PAST MEDICAL HISTORY: Past Medical History:  Diagnosis Date  . Anemia   . Constipation   . Gallbladder problem   . History of acute pancreatitis   . Hypertension   . Left shoulder pain   . Low back pain     PAST SURGICAL HISTORY: Past Surgical History:  Procedure Laterality Date  . ABDOMINAL HYSTERECTOMY    . ABDOMINAL HYSTERECTOMY    . CHOLECYSTECTOMY    . PANCREAS SURGERY      SOCIAL HISTORY: Social History   Tobacco Use  . Smoking status: Former Smoker    Last attempt to quit: 01/01/1997    Years since quitting: 20.0  . Smokeless tobacco: Never Used  Substance Use Topics  . Alcohol use: Yes    Comment: occ  . Drug use: Not on file    FAMILY HISTORY: Family History  Problem Relation Age of Onset  . Hypertension Mother   . Hypertension Father   . Cancer Other     ROS: Review of Systems  Constitutional: Positive for malaise/fatigue.  Respiratory: Positive for shortness of breath (on exertion).   Cardiovascular: Negative for chest pain, palpitations and orthopnea.       Negative syncope Negative chest pressure  Musculoskeletal: Positive for back pain.       Muscle or Joint Pain  Neurological: Positive for headaches. Negative for dizziness.  Psychiatric/Behavioral: The patient has insomnia.     PHYSICAL EXAM: Blood pressure (!) 148/88, pulse 80, height 5\' 3"  (1.6 m), weight 271 lb (122.9 kg), SpO2 97 %. Body mass index is 48.01 kg/m. Physical Exam  Constitutional: She is oriented to person, place, and time. She appears well-developed and well-nourished.  HENT:  Head: Normocephalic and atraumatic.  Nose: Nose normal.  Eyes: EOM are normal. No scleral icterus.  Neck: Normal range of motion. Neck supple. No thyromegaly present.    Cardiovascular: Normal rate and regular rhythm.  Murmur (heard best at aortic area) heard.  Systolic murmur is present with a grade of 3/6. Pulmonary/Chest: Effort normal. No respiratory distress.  Abdominal: Soft. There is no tenderness.  + obesity  Musculoskeletal: Normal range of motion.  Range of Motion normal in all 4 extremities  Neurological: She is alert and oriented to person, place, and time. Coordination normal.  Skin: Skin is warm and dry.  Psychiatric: She has a normal mood and affect. Her behavior is normal.  Vitals reviewed.   RECENT LABS AND TESTS: BMET    Component Value Date/Time   NA 139 08/05/2010 0540   K 3.7 08/05/2010 0540   CL 103 08/05/2010 0540   CO2 30 08/05/2010 0540   GLUCOSE 91 08/05/2010 0540   BUN 6 08/05/2010 0540   CREATININE 0.70 08/05/2010 0540   CALCIUM 9.3 08/05/2010 0540   CALCIUM 9.1 08/01/2010 2213   GFRNONAA >60 08/05/2010 0540   GFRAA >60 08/05/2010 0540   No  results found for: HGBA1C No results found for: INSULIN CBC    Component Value Date/Time   WBC 8.0 08/05/2010 0540   RBC 4.75 08/05/2010 0540   HGB 11.6 (L) 08/05/2010 0540   HCT 36.1 08/05/2010 0540   PLT 156 08/05/2010 0540   MCV 76.0 (L) 08/05/2010 0540   MCH 24.4 (L) 08/05/2010 0540   MCHC 32.1 08/05/2010 0540   RDW 14.9 08/05/2010 0540   LYMPHSABS 2.1 08/03/2010 0526   MONOABS 0.8 08/03/2010 0526   EOSABS 0.0 08/03/2010 0526   BASOSABS 0.0 08/03/2010 0526   Iron/TIBC/Ferritin/ %Sat No results found for: IRON, TIBC, FERRITIN, IRONPCTSAT Lipid Panel     Component Value Date/Time   CHOL 171 08/01/2010 2213   TRIG 56 08/01/2010 2213   HDL 55 08/01/2010 2213   CHOLHDL 3.1 08/01/2010 2213   VLDL 11 08/01/2010 2213   LDLCALC 105 (H) 08/01/2010 2213   Hepatic Function Panel     Component Value Date/Time   PROT 7.2 08/06/2010 0520   ALBUMIN 3.0 (L) 08/06/2010 0520   AST 41 (H) 08/06/2010 0520   ALT 103 (H) 08/06/2010 0520   ALKPHOS 423 (H) 08/06/2010  0520   BILITOT 0.6 08/06/2010 0520   BILIDIR 0.2 08/06/2010 0520   IBILI 0.4 08/06/2010 0520      Component Value Date/Time   TSH 0.997 08/01/2010 2213   Vitamin D There are no recent labs  ECG  shows NSR with a rate of 83 BPM INDIRECT CALORIMETER done today shows a VO2 of 252 and a REE of 1755. Her calculated basal metabolic rate is 7062 thus her basal metabolic rate is worse than expected.    ASSESSMENT AND PLAN: Other fatigue - Plan: EKG 12-Lead, CBC With Differential, VITAMIN D 25 Hydroxy (Vit-D Deficiency, Fractures), Vitamin B12, Folate, T3, T4, free, TSH  Shortness of breath on exertion - Plan: ECHOCARDIOGRAM COMPLETE  Essential hypertension - Plan: Comprehensive metabolic panel, Hemoglobin A1c, Insulin, random, Lipid Panel With LDL/HDL Ratio  LVH (left ventricular hypertrophy) - Plan: ECHOCARDIOGRAM COMPLETE  Depression screening  At risk for heart disease  Class 3 severe obesity with serious comorbidity and body mass index (BMI) of 45.0 to 49.9 in adult, unspecified obesity type (HCC)  PLAN:  Fatigue Averee was informed that her fatigue may be related to obesity, depression or many other causes. Labs will be ordered, and in the meanwhile Khole has agreed to work on diet, exercise and weight loss to help with fatigue. Proper sleep hygiene was discussed including the need for 7-8 hours of quality sleep each night. A sleep study was not ordered based on symptoms and Epworth score. We will check vitamin D lab, TSH, CBC and EKG.  Dyspnea on exertion Anisten's shortness of breath appears to be obesity related and exercise induced. She has agreed to work on weight loss and gradually increase exercise to treat her exercise induced shortness of breath. If Nyja follows our instructions and loses weight without improvement of her shortness of breath, we will plan to refer to pulmonology. We will order Indirect Calorimetry and we will monitor this condition  regularly. Shanay agrees to this plan.  Hypertension We discussed sodium restriction, working on healthy weight loss, and a regular exercise program as the means to achieve improved blood pressure control. Curtina agreed with this plan and agreed to follow up as directed. We will continue to monitor her blood pressure as well as her progress with the above lifestyle modifications. She will continue her medications as prescribed  and will watch for signs of hypotension as she continues her lifestyle modifications. She is to follow up with PCP for medication adjustment. We will order CMP and follow up at next visit.  Left Ventricular Hypertrophy with history of phentermine EKG with LVH We will refer for echocardiogram to [place] and Marce will follow up with our clinic in 2 weeks.  Cardiovascular risk counseling Catheline was given extended (15 minutes) coronary artery disease prevention counseling today. She is 52 y.o. female and has risk factors for heart disease including obesity, hypertension and LVH. We discussed intensive lifestyle modifications today with an emphasis on specific weight loss instructions and strategies. Pt was also informed of the importance of increasing exercise and decreasing saturated fats to help prevent heart disease.  Depression Screen Kendalynn had a mildly positive depression screening. Depression is commonly associated with obesity and often results in emotional eating behaviors. We will monitor this closely and work on CBT to help improve the non-hunger eating patterns. Referral to Psychology may be required if no improvement is seen as she continues in our clinic.  Obesity Eular is currently in the action stage of change and her goal is to continue with weight loss efforts She has agreed to follow the Category 2 plan +100 calories Fionna has been instructed to work up to a goal of 150 minutes of combined cardio and  strengthening exercise per week for weight loss and overall health benefits. We discussed the following Behavioral Modification Strategies today: increasing lean protein intake and work on meal planning and easy cooking plans  Rennie has agreed to follow up with our clinic in 2 weeks. She was informed of the importance of frequent follow up visits to maximize her success with intensive lifestyle modifications for her multiple health conditions. She was informed we would discuss her lab results at her next visit unless there is a critical issue that needs to be addressed sooner. Brianca agreed to keep her next visit at the agreed upon time to discuss these results.    OBESITY BEHAVIORAL INTERVENTION VISIT  Today's visit was # 1 out of 22.  Starting weight: 271 lbs Starting date: 01/23/17 Today's weight : 271 lbs Today's date: 01/23/2017 Total lbs lost to date: 0 (Patients must lose 7 lbs in the first 6 months to continue with counseling)   ASK: We discussed the diagnosis of obesity with Dayanna Leonetti today and Myosha agreed to give [place] permission to discuss obesity behavioral modification therapy today.  ASSESS: Mikaiya has the diagnosis of obesity and her BMI today is 48.02 Sokhna is in the action stage of change   ADVISE: Axel was educated on the multiple health risks of obesity as well as the benefit of weight loss to improve her health. She was advised of the need for long term treatment and the importance of lifestyle modifications.  AGREE: Multiple dietary modification options and treatment options were discussed and  Myosha agreed to the above obesity treatment plan.   I, Doreene Nest, am acting as transcriptionist for Dennard Nip, MD  I have reviewed the above documentation for accuracy and completeness, and I agree with the above. -Dennard Nip, MD

## 2017-01-24 LAB — FOLATE: Folate: >20 ng/mL (ref >3.0)

## 2017-01-24 LAB — VITAMIN B12: Vitamin B-12: 511 pg/mL (ref 232–1245)

## 2017-01-24 LAB — COMPREHENSIVE METABOLIC PANEL
ALBUMIN: 4.2 g/dL (ref 3.5–5.5)
ALK PHOS: 102 IU/L (ref 39–117)
ALT: 17 IU/L (ref 0–32)
AST: 15 IU/L (ref 0–40)
Albumin/Globulin Ratio: 1.4 (ref 1.2–2.2)
BILIRUBIN TOTAL: 0.4 mg/dL (ref 0.0–1.2)
BUN / CREAT RATIO: 15 (ref 9–23)
BUN: 12 mg/dL (ref 6–24)
CHLORIDE: 99 mmol/L (ref 96–106)
CO2: 29 mmol/L (ref 20–29)
Calcium: 9.5 mg/dL (ref 8.7–10.2)
Creatinine, Ser: 0.78 mg/dL (ref 0.57–1.00)
GFR calc Af Amer: 102 mL/min/{1.73_m2} (ref >59)
GFR calc non Af Amer: 88 mL/min/{1.73_m2} (ref >59)
GLOBULIN, TOTAL: 3 g/dL (ref 1.5–4.5)
GLUCOSE: 93 mg/dL (ref 65–99)
Potassium: 4.3 mmol/L (ref 3.5–5.2)
SODIUM: 142 mmol/L (ref 134–144)
Total Protein: 7.2 g/dL (ref 6.0–8.5)

## 2017-01-24 LAB — CBC WITH DIFFERENTIAL
Basophils Absolute: 0 10*3/uL (ref 0.0–0.2)
Basos: 0 %
EOS (ABSOLUTE): 0.1 10*3/uL (ref 0.0–0.4)
Eos: 2 %
Hematocrit: 39.5 % (ref 34.0–46.6)
Hemoglobin: 13.1 g/dL (ref 11.1–15.9)
IMMATURE GRANULOCYTES: 0 %
Immature Grans (Abs): 0 10*3/uL (ref 0.0–0.1)
LYMPHS: 46 %
Lymphocytes Absolute: 2.4 10*3/uL (ref 0.7–3.1)
MCH: 23.5 pg — ABNORMAL LOW (ref 26.6–33.0)
MCHC: 33.2 g/dL (ref 31.5–35.7)
MCV: 71 fL — ABNORMAL LOW (ref 79–97)
MONOS ABS: 0.4 10*3/uL (ref 0.1–0.9)
Monocytes: 7 %
NEUTROS PCT: 45 %
Neutrophils Absolute: 2.4 10*3/uL (ref 1.4–7.0)
RBC: 5.57 x10E6/uL — AB (ref 3.77–5.28)
RDW: 15.8 % — AB (ref 12.3–15.4)
WBC: 5.2 10*3/uL (ref 3.4–10.8)

## 2017-01-24 LAB — LIPID PANEL WITH LDL/HDL RATIO
CHOLESTEROL TOTAL: 169 mg/dL (ref 100–199)
HDL: 62 mg/dL (ref >39)
LDL CALC: 97 mg/dL (ref 0–99)
LDL/HDL RATIO: 1.6 ratio (ref 0.0–3.2)
TRIGLYCERIDES: 48 mg/dL (ref 0–149)
VLDL Cholesterol Cal: 10 mg/dL (ref 5–40)

## 2017-01-24 LAB — INSULIN, RANDOM: INSULIN: 21.3 u[IU]/mL (ref 2.6–24.9)

## 2017-01-24 LAB — TSH: TSH: 3.32 u[IU]/mL (ref 0.450–4.500)

## 2017-01-24 LAB — HEMOGLOBIN A1C
Est. average glucose Bld gHb Est-mCnc: 128 mg/dL
Hgb A1c MFr Bld: 6.1 % — ABNORMAL HIGH (ref 4.8–5.6)

## 2017-01-24 LAB — VITAMIN D 25 HYDROXY (VIT D DEFICIENCY, FRACTURES): Vit D, 25-Hydroxy: 39 ng/mL (ref 30.0–100.0)

## 2017-01-24 LAB — T4, FREE: Free T4: 1.26 ng/dL (ref 0.82–1.77)

## 2017-01-24 LAB — T3: T3, Total: 124 ng/dL (ref 71–180)

## 2017-01-25 ENCOUNTER — Ambulatory Visit (HOSPITAL_COMMUNITY): Payer: 59 | Attending: Internal Medicine

## 2017-01-25 ENCOUNTER — Other Ambulatory Visit: Payer: Self-pay

## 2017-01-25 DIAGNOSIS — R0602 Shortness of breath: Secondary | ICD-10-CM | POA: Diagnosis not present

## 2017-01-25 DIAGNOSIS — I517 Cardiomegaly: Secondary | ICD-10-CM | POA: Diagnosis not present

## 2017-02-06 ENCOUNTER — Ambulatory Visit (INDEPENDENT_AMBULATORY_CARE_PROVIDER_SITE_OTHER): Payer: 59 | Admitting: Family Medicine

## 2017-02-06 VITALS — BP 128/82 | HR 79 | Temp 98.5°F | Ht 63.0 in | Wt 267.0 lb

## 2017-02-06 DIAGNOSIS — I10 Essential (primary) hypertension: Secondary | ICD-10-CM

## 2017-02-06 DIAGNOSIS — Z6841 Body Mass Index (BMI) 40.0 and over, adult: Secondary | ICD-10-CM

## 2017-02-06 DIAGNOSIS — R7303 Prediabetes: Secondary | ICD-10-CM | POA: Diagnosis not present

## 2017-02-06 DIAGNOSIS — D509 Iron deficiency anemia, unspecified: Secondary | ICD-10-CM

## 2017-02-06 DIAGNOSIS — E559 Vitamin D deficiency, unspecified: Secondary | ICD-10-CM

## 2017-02-06 DIAGNOSIS — Z9189 Other specified personal risk factors, not elsewhere classified: Secondary | ICD-10-CM

## 2017-02-06 MED ORDER — VITAMIN D (ERGOCALCIFEROL) 1.25 MG (50000 UNIT) PO CAPS: 50000.0000 [IU] | ORAL_CAPSULE | ORAL | 0 refills | Status: DC

## 2017-02-06 MED ORDER — METFORMIN HCL 500 MG PO TABS: 500.0000 mg | ORAL_TABLET | Freq: Every day | ORAL | 0 refills | Status: DC

## 2017-02-06 NOTE — Progress Notes (Signed)
Office: [phone]  /  Fax: [phone]   HPI:   Chief Complaint: OBESITY [name] is here to discuss her progress with her obesity treatment plan. She is on the Category 2 plan + 100 calories and is following her eating plan approximately 98 % of the time. She states she is exercising 0 minutes 0 times per week. [name] is missing flavoring of sauces and butter for meat at dinner. She is enjoying [employer] and [employer] ice cream bars. She feels frustrated she only lost 4 lbs, wanted to see at least 6 to 8 lbs.  Her weight is 267 lb (121.1 kg) today and has had a weight loss of 4 pounds over a period of 2 weeks since her last visit. She has lost 4 lbs since starting treatment with [place].  Vitamin D deficiency [name] has a diagnosis of vitamin D deficiency. She is not currently taking vit D, level at 39.0. She notes fatigue and denies nausea, vomiting or muscle weakness.  Pre-Diabetes [name] has a diagnosis of pre-diabetes based on her elevated Hgb A1c and was informed this puts her at greater risk of developing diabetes. Hgb A1c of 6.1 and insulin of 21.3 (Hgb A1c in August of 2018 was 5.8). She is not taking metformin currently and continues to work on diet and exercise to decrease risk of diabetes. She denies nausea or hypoglycemia.  At risk for diabetes [name] is at higher than average risk for developing diabetes due to her obesity and pre-diabetes. She currently denies polyuria or polydipsia.  Anemia (Microcytic) [name] has a diagnosis of anemia. She previously needed iron infusions. No iron studies or anemia panel, hematocrit within normal limits.   Hypertension [name] is a 52 y.o. female with hypertension. [name]'s blood pressure is better controlled today. She denies chest pain or shortness of breath. She is working weight loss to help control her blood pressure with the goal of decreasing her risk of heart attack and stroke.  [name]'s blood pressure is currently controlled.  ALLERGIES: Allergies  Allergen Reactions  . Contrast Media [Iodinated Diagnostic Agents] Nausea And Vomiting    Pt began vomiting after multihance injection     MEDICATIONS: Current Outpatient Medications on File Prior to Visit  Medication Sig Dispense Refill  . AMLODIPINE BESYLATE PO Take by mouth.    . Ascorbic Acid (VITAMIN C PO) Take by mouth.    . Azilsartan-Chlorthalidone (EDARBYCLOR PO) Take by mouth.    . Calcium 500-100 MG-UNIT CHEW Chew 1 tablet by mouth 2 (two) times daily.    . meloxicam (MOBIC) 15 MG tablet Take 15 mg by mouth daily as needed for pain.    . Multiple Vitamin (MULTIVITAMIN WITH MINERALS) TABS tablet Take 1 tablet by mouth daily.     No current facility-administered medications on file prior to visit.     PAST MEDICAL HISTORY: Past Medical History:  Diagnosis Date  . Anemia   . Constipation   . Gallbladder problem   . History of acute pancreatitis   . Hypertension   . Left shoulder pain   . Low back pain     PAST SURGICAL HISTORY: Past Surgical History:  Procedure Laterality Date  . ABDOMINAL HYSTERECTOMY    . ABDOMINAL HYSTERECTOMY    . CHOLECYSTECTOMY    . PANCREAS SURGERY      SOCIAL HISTORY: Social History   Tobacco Use  . Smoking status: Former Smoker    Last attempt to quit: 01/01/1997    Years since quitting: 20.1  .  Smokeless tobacco: Never Used  Substance Use Topics  . Alcohol use: Yes    Comment: occ  . Drug use: Not on file    FAMILY HISTORY: Family History  Problem Relation Age of Onset  . Hypertension Mother   . Hypertension Father   . Cancer Other     ROS: Review of Systems  Constitutional: Positive for weight loss.  Respiratory: Negative for shortness of breath.   Cardiovascular: Negative for chest pain.  Gastrointestinal: Negative for nausea and vomiting.  Genitourinary: Negative for frequency.  Musculoskeletal:       Negative muscle weakness    Endo/Heme/Allergies: Negative for polydipsia.       Negative hypoglycemia    PHYSICAL EXAM: Blood pressure 128/82, pulse 79, temperature 98.5 F (36.9 C), temperature source Oral, height 5\' 3"  (1.6 m), weight 267 lb (121.1 kg), SpO2 99 %. Body mass index is 47.3 kg/m. Physical Exam  Constitutional: She is oriented to person, place, and time. She appears well-developed and well-nourished.  Cardiovascular: Normal rate.  Pulmonary/Chest: Effort normal.  Musculoskeletal: Normal range of motion.  Neurological: She is oriented to person, place, and time.  Skin: Skin is warm and dry.  Psychiatric: She has a normal mood and affect. Her behavior is normal.  Vitals reviewed.   RECENT LABS AND TESTS: BMET    Component Value Date/Time   NA 142 01/23/2017 1206   K 4.3 01/23/2017 1206   CL 99 01/23/2017 1206   CO2 29 01/23/2017 1206   GLUCOSE 93 01/23/2017 1206   GLUCOSE 91 08/05/2010 0540   BUN 12 01/23/2017 1206   CREATININE 0.78 01/23/2017 1206   CALCIUM 9.5 01/23/2017 1206   CALCIUM 9.1 08/01/2010 2213   GFRNONAA 88 01/23/2017 1206   GFRAA 102 01/23/2017 1206   Lab Results  Component Value Date   HGBA1C 6.1 (H) 01/23/2017   Lab Results  Component Value Date   INSULIN 21.3 01/23/2017   CBC    Component Value Date/Time   WBC 5.2 01/23/2017 1206   WBC 8.0 08/05/2010 0540   RBC 5.57 (H) 01/23/2017 1206   RBC 4.75 08/05/2010 0540   HGB 13.1 01/23/2017 1206   HCT 39.5 01/23/2017 1206   PLT 156 08/05/2010 0540   MCV 71 (L) 01/23/2017 1206   MCH 23.5 (L) 01/23/2017 1206   MCH 24.4 (L) 08/05/2010 0540   MCHC 33.2 01/23/2017 1206   MCHC 32.1 08/05/2010 0540   RDW 15.8 (H) 01/23/2017 1206   LYMPHSABS 2.4 01/23/2017 1206   MONOABS 0.8 08/03/2010 0526   EOSABS 0.1 01/23/2017 1206   BASOSABS 0.0 01/23/2017 1206   Iron/TIBC/Ferritin/ %Sat No results found for: IRON, TIBC, FERRITIN, IRONPCTSAT Lipid Panel     Component Value Date/Time   CHOL 169 01/23/2017 1206   TRIG  48 01/23/2017 1206   HDL 62 01/23/2017 1206   CHOLHDL 3.1 08/01/2010 2213   VLDL 11 08/01/2010 2213   LDLCALC 97 01/23/2017 1206   Hepatic Function Panel     Component Value Date/Time   PROT 7.2 01/23/2017 1206   ALBUMIN 4.2 01/23/2017 1206   AST 15 01/23/2017 1206   ALT 17 01/23/2017 1206   ALKPHOS 102 01/23/2017 1206   BILITOT 0.4 01/23/2017 1206   BILIDIR 0.2 08/06/2010 0520   IBILI 0.4 08/06/2010 0520      Component Value Date/Time   TSH 3.320 01/23/2017 1206   TSH 0.997 08/01/2010 2213  Results for MISHAWN, DIDION (MRN [id number]) as of 02/06/2017 16:14  Ref. Range 01/23/2017  12:06  Vitamin D, 25-Hydroxy Latest Ref Range: 30.0 - 100.0 ng/mL 39.0    ASSESSMENT AND PLAN: Vitamin D deficiency - Plan: Vitamin D, Ergocalciferol, (DRISDOL) 50000 units CAPS capsule  Prediabetes - Plan: metFORMIN (GLUCOPHAGE) 500 MG tablet  Microcytic anemia  Essential hypertension  At risk for diabetes mellitus  Class 3 severe obesity with serious comorbidity and body mass index (BMI) of 45.0 to 49.9 in adult, unspecified obesity type ([place])  PLAN:  Vitamin D Deficiency Haelyn was informed that low vitamin D levels contributes to fatigue and are associated with obesity, breast, and colon cancer. Darielle agrees to start prescription Vit D @50 ,000 IU every week #4 with no refills. She will follow up for routine testing of vitamin D, at least 2-3 times per year. She was informed of the risk of over-replacement of vitamin D and agrees to not increase her dose unless she discusses this with [place] first. We will recheck labs in 3 months and Karah agrees to follow up with our clinic in 2 weeks.  Pre-Diabetes Trivia will continue to work on weight loss, exercise, and decreasing simple carbohydrates in her diet to help decrease the risk of diabetes. We dicussed metformin including benefits and risks. She was informed that eating too many simple carbohydrates or too many calories at  one sitting increases the likelihood of GI side effects. Anwar agrees to start metformin 500 mg PO q AM #30 with no refills and continue Category 2 meal plan. Signora agrees to follow up with our clinic in 2 weeks as directed to monitor her progress.  Diabetes risk counselling Cotina was given extended (15 minutes) diabetes prevention counseling today. She is 52 y.o. female and has risk factors for diabetes including obesity and pre-diabetes. We discussed intensive lifestyle modifications today with an emphasis on weight loss as well as increasing exercise and decreasing simple carbohydrates in her diet.  Anemia (Microcytic) The diagnosis of anemia microcytic was discussed with [place] and was explained in detail. She was given suggestions of iron rich foods and and iron supplement was not prescribed. Lorilee is to follow up with her primary care physician concerning microcytic anemia. Deliliah agrees to follow up with our clinic in 2 weeks.  Hypertension We discussed sodium restriction, working on healthy weight loss, and a regular exercise program as the means to achieve improved blood pressure control. Kersten agreed with this plan and agreed to follow up as directed. We will continue to monitor her blood pressure as well as her progress with the above lifestyle modifications. She will continue her medications as prescribed and will watch for signs of hypotension as she continues her lifestyle modifications and continue meal plan. Chanelle agrees to follow up with our clinic in 2 weeks.  Obesity Shere is currently in the action stage of change. As such, her goal is to continue with weight loss efforts She has agreed to follow the Category 2 plan + 100 calories Chloey has been instructed to work up to a goal of 150 minutes of combined cardio and strengthening exercise per week for weight loss and overall health benefits. We discussed the following  Behavioral Modification Strategies today: increasing lean protein intake, decreasing simple carbohydrates, work on meal planning and easy cooking plans, and planning for success   Taffie has agreed to follow up with our clinic in 2 weeks. She was informed of the importance of frequent follow up visits to maximize her success with intensive lifestyle modifications for her multiple health conditions.   OBESITY  BEHAVIORAL INTERVENTION VISIT  Today's visit was # 2 out of 22.  Starting weight: 271 lbs Starting date: 01/23/17 Today's weight : 267 lbs  Today's date: 02/06/2017 Total lbs lost to date: 4 (Patients must lose 7 lbs in the first 6 months to continue with counseling)   ASK: We discussed the diagnosis of obesity with Elisama Llerena today and Lashana agreed to give [place] permission to discuss obesity behavioral modification therapy today.  ASSESS: Gertrue has the diagnosis of obesity and her BMI today is 47.31 Murielle is in the action stage of change   ADVISE: Aniyla was educated on the multiple health risks of obesity as well as the benefit of weight loss to improve her health. She was advised of the need for long term treatment and the importance of lifestyle modifications.  AGREE: Multiple dietary modification options and treatment options were discussed and  Madasyn agreed to the above obesity treatment plan.  I, Trixie Dredge, am acting as transcriptionist for Ilene Qua, MD  I have reviewed the above documentation for accuracy and completeness, and I agree with the above. - Ilene Qua, MD

## 2017-02-11 DIAGNOSIS — M7542 Impingement syndrome of left shoulder: Secondary | ICD-10-CM | POA: Diagnosis not present

## 2017-02-20 ENCOUNTER — Ambulatory Visit (INDEPENDENT_AMBULATORY_CARE_PROVIDER_SITE_OTHER): Payer: 59 | Admitting: Family Medicine

## 2017-02-20 VITALS — BP 145/90 | HR 75 | Temp 98.5°F | Ht 63.0 in | Wt 264.0 lb

## 2017-02-20 DIAGNOSIS — Z6841 Body Mass Index (BMI) 40.0 and over, adult: Secondary | ICD-10-CM | POA: Diagnosis not present

## 2017-02-20 DIAGNOSIS — R7303 Prediabetes: Secondary | ICD-10-CM

## 2017-02-20 DIAGNOSIS — I10 Essential (primary) hypertension: Secondary | ICD-10-CM | POA: Diagnosis not present

## 2017-02-20 DIAGNOSIS — E559 Vitamin D deficiency, unspecified: Secondary | ICD-10-CM

## 2017-02-20 NOTE — Progress Notes (Signed)
Office: [phone]  /  Fax: [phone]   HPI:   Chief Complaint: OBESITY [name] is here to discuss her progress with her obesity treatment plan. She is on the  follow the Category 2 plan + 100 calories and is following her eating plan approximately 95 % of the time. She states she is not exercising.  [name] went to [place] for 1 week and did really well sticking to meal plan. She really enjoyed the [employer] bars while in [place].  Her weight is 264 lb (119.7 kg) today and has had a weight loss of 3 pounds over a period of 2 weeks since her last visit. She has lost 7 lbs since starting treatment with [place].  Hypertension [name] is a 52 y.o. female with hypertension.  [name] denies chest pain/pressure or shortness of breath on exertion. She is working weight loss to help control her blood pressure with the goal of decreasing her risk of heart attack and stroke. [name] blood pressure is not currently controlled.  Vitamin D deficiency [name] has a diagnosis of vitamin D deficiency. She is currently taking vit D and denies nausea, vomiting or muscle weakness.  Pre-Diabetes [name] has a diagnosis of prediabetes based on her elevated HgA1c and was informed this puts her at greater risk of developing diabetes. She is taking metformin currently, but has ankle swelling; will hold metformin for 3 days and see if ankle swelling improves. She continues to work on diet and exercise to decrease risk of diabetes. She denies nausea or hypoglycemia.    ALLERGIES: Allergies  Allergen Reactions  . Contrast Media [Iodinated Diagnostic Agents] Nausea And Vomiting    Pt began vomiting after multihance injection     MEDICATIONS: Current Outpatient Medications on File Prior to Visit  Medication Sig Dispense Refill  . AMLODIPINE BESYLATE PO Take by mouth.    . Ascorbic Acid (VITAMIN C PO) Take by mouth.    . Azilsartan-Chlorthalidone (EDARBYCLOR PO)  Take by mouth.    . Calcium 500-100 MG-UNIT CHEW Chew 1 tablet by mouth 2 (two) times daily.    . meloxicam (MOBIC) 15 MG tablet Take 15 mg by mouth daily as needed for pain.    . metFORMIN (GLUCOPHAGE) 500 MG tablet Take 1 tablet (500 mg total) by mouth daily with breakfast. 30 tablet 0  . Multiple Vitamin (MULTIVITAMIN WITH MINERALS) TABS tablet Take 1 tablet by mouth daily.    . Vitamin D, Ergocalciferol, (DRISDOL) 50000 units CAPS capsule Take 1 capsule (50,000 Units total) by mouth every 7 (seven) days. 4 capsule 0   No current facility-administered medications on file prior to visit.     PAST MEDICAL HISTORY: Past Medical History:  Diagnosis Date  . Anemia   . Constipation   . Gallbladder problem   . History of acute pancreatitis   . Hypertension   . Left shoulder pain   . Low back pain     PAST SURGICAL HISTORY: Past Surgical History:  Procedure Laterality Date  . ABDOMINAL HYSTERECTOMY    . ABDOMINAL HYSTERECTOMY    . CHOLECYSTECTOMY    . PANCREAS SURGERY      SOCIAL HISTORY: Social History   Tobacco Use  . Smoking status: Former Smoker    Last attempt to quit: 01/01/1997    Years since quitting: 20.1  . Smokeless tobacco: Never Used  Substance Use Topics  . Alcohol use: Yes    Comment: occ  . Drug use: Not on file  FAMILY HISTORY: Family History  Problem Relation Age of Onset  . Hypertension Mother   . Hypertension Father   . Cancer Other     ROS: Review of Systems  Constitutional: Positive for weight loss.  Respiratory: Negative for shortness of breath.   Cardiovascular: Positive for leg swelling. Negative for chest pain.  Gastrointestinal: Negative for nausea and vomiting.  Musculoskeletal:       Negative for muscle weakness.   Neurological: Negative for headaches.  Endo/Heme/Allergies:       Negative for hypoglycemia.     PHYSICAL EXAM: Blood pressure (!) 145/90, pulse 75, temperature 98.5 F (36.9 C), temperature source Oral, height 5'  3" (1.6 m), weight 264 lb (119.7 kg), SpO2 97 %. Body mass index is 46.77 kg/m. Physical Exam  Constitutional: She is oriented to person, place, and time. She appears well-developed and well-nourished.  HENT:  Head: Normocephalic.  Eyes: EOM are normal.  Neck: Normal range of motion.  Cardiovascular: Normal rate.  Pulmonary/Chest: Effort normal.  Musculoskeletal: Normal range of motion.  Neurological: She is alert and oriented to person, place, and time.  Skin: Skin is warm and dry.  Psychiatric: She has a normal mood and affect. Her behavior is normal.  Vitals reviewed.   RECENT LABS AND TESTS: BMET    Component Value Date/Time   NA 142 01/23/2017 1206   K 4.3 01/23/2017 1206   CL 99 01/23/2017 1206   CO2 29 01/23/2017 1206   GLUCOSE 93 01/23/2017 1206   GLUCOSE 91 08/05/2010 0540   BUN 12 01/23/2017 1206   CREATININE 0.78 01/23/2017 1206   CALCIUM 9.5 01/23/2017 1206   CALCIUM 9.1 08/01/2010 2213   GFRNONAA 88 01/23/2017 1206   GFRAA 102 01/23/2017 1206   Lab Results  Component Value Date   HGBA1C 6.1 (H) 01/23/2017   Lab Results  Component Value Date   INSULIN 21.3 01/23/2017   CBC    Component Value Date/Time   WBC 5.2 01/23/2017 1206   WBC 8.0 08/05/2010 0540   RBC 5.57 (H) 01/23/2017 1206   RBC 4.75 08/05/2010 0540   HGB 13.1 01/23/2017 1206   HCT 39.5 01/23/2017 1206   PLT 156 08/05/2010 0540   MCV 71 (L) 01/23/2017 1206   MCH 23.5 (L) 01/23/2017 1206   MCH 24.4 (L) 08/05/2010 0540   MCHC 33.2 01/23/2017 1206   MCHC 32.1 08/05/2010 0540   RDW 15.8 (H) 01/23/2017 1206   LYMPHSABS 2.4 01/23/2017 1206   MONOABS 0.8 08/03/2010 0526   EOSABS 0.1 01/23/2017 1206   BASOSABS 0.0 01/23/2017 1206   Iron/TIBC/Ferritin/ %Sat No results found for: IRON, TIBC, FERRITIN, IRONPCTSAT Lipid Panel     Component Value Date/Time   CHOL 169 01/23/2017 1206   TRIG 48 01/23/2017 1206   HDL 62 01/23/2017 1206   CHOLHDL 3.1 08/01/2010 2213   VLDL 11 08/01/2010  2213   LDLCALC 97 01/23/2017 1206   Hepatic Function Panel     Component Value Date/Time   PROT 7.2 01/23/2017 1206   ALBUMIN 4.2 01/23/2017 1206   AST 15 01/23/2017 1206   ALT 17 01/23/2017 1206   ALKPHOS 102 01/23/2017 1206   BILITOT 0.4 01/23/2017 1206   BILIDIR 0.2 08/06/2010 0520   IBILI 0.4 08/06/2010 0520      Component Value Date/Time   TSH 3.320 01/23/2017 1206   TSH 0.997 08/01/2010 2213    ASSESSMENT AND PLAN: No diagnosis found.  PLAN: Hypertension We discussed sodium restriction, working on healthy weight  loss, and a regular exercise program as the means to achieve improved blood pressure control. [name] agreed with this plan and agreed to follow up as directed. We will continue to monitor her blood pressure as well as her progress with the above lifestyle modifications. She will continue her medications as prescribed and will watch for signs of hypotension as she continues her lifestyle modifications.  Vitamin D Deficiency [name] was informed that low vitamin D levels contributes to fatigue and are associated with obesity, breast, and colon cancer. She agrees to continue to take prescription Vit D @50 ,000 IU every week and will follow up for routine testing of vitamin D, at least 2-3 times per year. She was informed of the risk of over-replacement of vitamin D and agrees to not increase her dose unless she discusses this with [place] first.  Pre-Diabetes [name] will continue to work on weight loss, exercise, and decreasing simple carbohydrates in her diet to help decrease the risk of diabetes. We dicussed metformin including benefits and risks. She was informed that eating too many simple carbohydrates or too many calories at one sitting increases the likelihood of GI side effects. [name] agreed to follow up with [place] as directed to monitor her progress. Will follow up on her ankle swelling at next visit.   Obesity [name] is currently in the action  stage of change. As such, her goal is to continue with weight loss efforts She has agreed to follow the Category 2 plan +100 calories.  [name] has been instructed to work up to a goal of 150 minutes of combined cardio and strengthening exercise per week for weight loss and overall health benefits. We discussed the following Behavioral Modification Strategies today: increasing lean protein intake, meal planning and cooking strategies, better snacking choices, travel eating strategies, and planning for success.    [name] has agreed to follow up with our clinic in 2 weeks. She was informed of the importance of frequent follow up visits to maximize her success with intensive lifestyle modifications for her multiple health conditions.   OBESITY BEHAVIORAL INTERVENTION VISIT  Today's visit was # 3 out of 22.  Starting weight: 271 lbs Starting date: 01/23/17 Today's weight :264 lbs Today's date: 02/20/2017 Total lbs lost to date: 7 (Patients must lose 7 lbs in the first 6 months to continue with counseling)   ASK: We discussed the diagnosis of obesity with [name] today and [name] agreed to give [place] permission to discuss obesity behavioral modification therapy today.  ASSESS: [name] has the diagnosis of obesity and her BMI today is 46.77 [name] is in the action stage of change   ADVISE: [name] was educated on the multiple health risks of obesity as well as the benefit of weight loss to improve her health. She was advised of the need for long term treatment and the importance of lifestyle modifications.  AGREE: Multiple dietary modification options and treatment options were discussed and  [name] agreed to the above obesity treatment plan.  I, Renee Ramus, am acting as transcriptionist for Ilene Qua, MD  I have reviewed the above documentation for accuracy and completeness, and I agree with the above. - Ilene Qua, MD

## 2017-03-05 ENCOUNTER — Ambulatory Visit (INDEPENDENT_AMBULATORY_CARE_PROVIDER_SITE_OTHER): Payer: 59 | Admitting: Family Medicine

## 2017-03-05 VITALS — BP 129/80 | HR 87 | Temp 98.8°F | Ht 63.0 in | Wt 265.0 lb

## 2017-03-05 DIAGNOSIS — R7303 Prediabetes: Secondary | ICD-10-CM | POA: Diagnosis not present

## 2017-03-05 DIAGNOSIS — Z6841 Body Mass Index (BMI) 40.0 and over, adult: Secondary | ICD-10-CM

## 2017-03-05 DIAGNOSIS — I10 Essential (primary) hypertension: Secondary | ICD-10-CM

## 2017-03-05 DIAGNOSIS — E559 Vitamin D deficiency, unspecified: Secondary | ICD-10-CM | POA: Diagnosis not present

## 2017-03-05 DIAGNOSIS — Z9189 Other specified personal risk factors, not elsewhere classified: Secondary | ICD-10-CM

## 2017-03-05 MED ORDER — VITAMIN D (ERGOCALCIFEROL) 1.25 MG (50000 UNIT) PO CAPS: 50000.0000 [IU] | ORAL_CAPSULE | ORAL | 0 refills | Status: DC

## 2017-03-06 NOTE — Progress Notes (Signed)
Office: [phone]  /  Fax: [phone]   HPI:   Chief Complaint: OBESITY [name] is here to discuss her progress with her obesity treatment plan. She is on the Category 2 plan + 100 calories and is following her eating plan approximately 95 % of the time. She states she is exercising 0 minutes 0 times per week. Airyanna significant nausea for most of past 2 weeks, has been trying to get most of meal plan in. 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  Her weight is 265 lb (120.2 kg) today and has gained 1 pound since her last visit. She has lost 6 lbs since starting treatment with [place].  Hypertension [name] is a 53 y.o. female with hypertension. [name]'s blood pressure is controlled today. She denies chest pain, chest pressure, or headache. She is working weight loss to help control her blood pressure with the goal of decreasing her risk of heart attack and stroke. Lariah's blood pressure is currently controlled.  Vitamin D Deficiency [name] has a diagnosis of vitamin D deficiency. She is currently taking prescription Vit D. She notes fatigue improving and denies nausea, vomiting or muscle weakness.  At risk for osteopenia and osteoporosis [name] is at higher risk of osteopenia and osteoporosis due to vitamin D deficiency.   Pre-Diabetes [name] has a diagnosis of pre-diabetes based on her elevated Hgb A1c and was informed this puts her at greater risk of developing diabetes. She is on metformin, having some GI upset with recent GI virus. She continues to work on diet and exercise to decrease risk of diabetes. She denies nausea or hypoglycemia.  ALLERGIES: Allergies  Allergen Reactions  . Contrast Media [Iodinated Diagnostic Agents] Nausea And Vomiting    Pt began vomiting after multihance injection     MEDICATIONS: Current Outpatient Medications on File Prior to Visit  Medication Sig Dispense Refill  . AMLODIPINE BESYLATE  PO Take by mouth.    . Ascorbic Acid (VITAMIN C PO) Take by mouth.    . Azilsartan-Chlorthalidone (EDARBYCLOR PO) Take by mouth.    . Calcium 500-100 MG-UNIT CHEW Chew 1 tablet by mouth 2 (two) times daily.    . meloxicam (MOBIC) 15 MG tablet Take 15 mg by mouth daily as needed for pain.    . metFORMIN (GLUCOPHAGE) 500 MG tablet Take 1 tablet (500 mg total) by mouth daily with breakfast. 30 tablet 0  . Multiple Vitamin (MULTIVITAMIN WITH MINERALS) TABS tablet Take 1 tablet by mouth daily.     No current facility-administered medications on file prior to visit.     PAST MEDICAL HISTORY: Past Medical History:  Diagnosis Date  . Anemia   . Constipation   . Gallbladder problem   . History of acute pancreatitis   . Hypertension   . Left shoulder pain   . Low back pain     PAST SURGICAL HISTORY: Past Surgical History:  Procedure Laterality Date  . ABDOMINAL HYSTERECTOMY    . ABDOMINAL HYSTERECTOMY    . CHOLECYSTECTOMY    . PANCREAS SURGERY      SOCIAL HISTORY: Social History   Tobacco Use  . Smoking status: Former Smoker    Last attempt to quit: 01/01/1997    Years since quitting: 20.1  . Smokeless tobacco: Never Used  Substance Use Topics  . Alcohol use: Yes    Comment: occ  . Drug use: Not on file    FAMILY HISTORY: Family History  Problem Relation Age of Onset  .  Hypertension Mother   . Hypertension Father   . Cancer Other     ROS: Review of Systems  Constitutional: Positive for malaise/fatigue. Negative for weight loss.  Cardiovascular: Negative for chest pain.       Negative chest pressure  Gastrointestinal: Negative for nausea and vomiting.  Musculoskeletal:       Negative muscle weakness  Neurological: Negative for headaches.  Endo/Heme/Allergies:       Negative hypoglycemia    PHYSICAL EXAM: Blood pressure 129/80, pulse 87, temperature 98.8 F (37.1 C), temperature source Oral, height 5\' 3"  (1.6 m), weight 265 lb (120.2 kg), SpO2 98 %. Body mass  index is 46.94 kg/m. Physical Exam  Constitutional: She is oriented to person, place, and time. She appears well-developed and well-nourished.  Cardiovascular: Normal rate.  Pulmonary/Chest: Effort normal.  Musculoskeletal: Normal range of motion.  Neurological: She is oriented to person, place, and time.  Skin: Skin is warm and dry.  Psychiatric: She has a normal mood and affect. Her behavior is normal.  Vitals reviewed.   RECENT LABS AND TESTS: BMET    Component Value Date/Time   NA 142 01/23/2017 1206   K 4.3 01/23/2017 1206   CL 99 01/23/2017 1206   CO2 29 01/23/2017 1206   GLUCOSE 93 01/23/2017 1206   GLUCOSE 91 08/05/2010 0540   BUN 12 01/23/2017 1206   CREATININE 0.78 01/23/2017 1206   CALCIUM 9.5 01/23/2017 1206   CALCIUM 9.1 08/01/2010 2213   GFRNONAA 88 01/23/2017 1206   GFRAA 102 01/23/2017 1206   Lab Results  Component Value Date   HGBA1C 6.1 (H) 01/23/2017   Lab Results  Component Value Date   INSULIN 21.3 01/23/2017   CBC    Component Value Date/Time   WBC 5.2 01/23/2017 1206   WBC 8.0 08/05/2010 0540   RBC 5.57 (H) 01/23/2017 1206   RBC 4.75 08/05/2010 0540   HGB 13.1 01/23/2017 1206   HCT 39.5 01/23/2017 1206   PLT 156 08/05/2010 0540   MCV 71 (L) 01/23/2017 1206   MCH 23.5 (L) 01/23/2017 1206   MCH 24.4 (L) 08/05/2010 0540   MCHC 33.2 01/23/2017 1206   MCHC 32.1 08/05/2010 0540   RDW 15.8 (H) 01/23/2017 1206   LYMPHSABS 2.4 01/23/2017 1206   MONOABS 0.8 08/03/2010 0526   EOSABS 0.1 01/23/2017 1206   BASOSABS 0.0 01/23/2017 1206   Iron/TIBC/Ferritin/ %Sat No results found for: IRON, TIBC, FERRITIN, IRONPCTSAT Lipid Panel     Component Value Date/Time   CHOL 169 01/23/2017 1206   TRIG 48 01/23/2017 1206   HDL 62 01/23/2017 1206   CHOLHDL 3.1 08/01/2010 2213   VLDL 11 08/01/2010 2213   LDLCALC 97 01/23/2017 1206   Hepatic Function Panel     Component Value Date/Time   PROT 7.2 01/23/2017 1206   ALBUMIN 4.2 01/23/2017 1206   AST  15 01/23/2017 1206   ALT 17 01/23/2017 1206   ALKPHOS 102 01/23/2017 1206   BILITOT 0.4 01/23/2017 1206   BILIDIR 0.2 08/06/2010 0520   IBILI 0.4 08/06/2010 0520      Component Value Date/Time   TSH 3.320 01/23/2017 1206   TSH 0.997 08/01/2010 2213  Results for CACHET, MCCUTCHEN (MRN [id number]) as of 03/06/2017 07:34  Ref. Range 01/23/2017 12:06  Vitamin D, 25-Hydroxy Latest Ref Range: 30.0 - 100.0 ng/mL 39.0    ASSESSMENT AND PLAN: Essential hypertension  Vitamin D deficiency - Plan: Vitamin D, Ergocalciferol, (DRISDOL) 50000 units CAPS capsule  Pre-diabetes  At risk  for osteoporosis  Class 3 severe obesity with serious comorbidity and body mass index (BMI) of 45.0 to 49.9 in adult, unspecified obesity type ([place])  PLAN:  Hypertension We discussed sodium restriction, working on healthy weight loss, and a regular exercise program as the means to achieve improved blood pressure control. Orlinda agreed with this plan and agreed to follow up as directed. We will continue to monitor her blood pressure as well as her progress with the above lifestyle modifications. She will continue amlodipine as prescribed and will watch for signs of hypotension as she continues her lifestyle modifications. Jini agrees to follow up with our clinic in 2 weeks.  Vitamin D Deficiency [name] was informed that low vitamin D levels contributes to fatigue and are associated with obesity, breast, and colon cancer. [name] agrees to continue taking prescription Vit D @50 ,000 IU every week #4 and we will refill for 1 month. She will follow up for routine testing of vitamin D, at least 2-3 times per year. She was informed of the risk of over-replacement of vitamin D and agrees to not increase her dose unless she discusses this with [place] first. [name] agrees to follow up with our clinic in 2 weeks.   At risk for osteopenia and osteoporosis [name] is at risk for osteopenia and osteoporsis  due to her vitamin D deficiency. She was encouraged to take her vitamin D and follow her higher calcium diet and increase strengthening exercise to help strengthen her bones and decrease her risk of osteopenia and osteoporosis.  Pre-Diabetes [name] will continue to work on weight loss, exercise, and decreasing simple carbohydrates in her diet to help decrease the risk of diabetes. We dicussed metformin including benefits and risks. She was informed that eating too many simple carbohydrates or too many calories at one sitting increases the likelihood of GI side effects. She will continue metformin daily and Elaine agrees to follow up with our clinic in 2 weeks as directed to monitor her progress.  Obesity [name] is currently in the action stage of change. As such, her goal is to continue with weight loss efforts She has agreed to keep a food journal with 400-500 calories and 35+ grams of protein at supper daily and follow the Category 2 plan + 100 calories [name] has been instructed to work up to a goal of 150 minutes of combined cardio and strengthening exercise per week for weight loss and overall health benefits. We discussed the following Behavioral Modification Strategies today: work on meal planning and easy cooking plans, increase H20 intake, planning for success, and keep a strict food journal   [name] has agreed to follow up with our clinic in 2 weeks. She was informed of the importance of frequent follow up visits to maximize her success with intensive lifestyle modifications for her multiple health conditions.   OBESITY BEHAVIORAL INTERVENTION VISIT  Today's visit was # 4 out of 22.  Starting weight: 271 lbs Starting date: 01/23/17 Today's weight : 265 lbs  Today's date: 03/05/2017 Total lbs lost to date: 6 (Patients must lose 7 lbs in the first 6 months to continue with counseling)   ASK: We discussed the diagnosis of obesity with [name] today  and [name] agreed to give [place] permission to discuss obesity behavioral modification therapy today.  ASSESS: [name] has the diagnosis of obesity and her BMI today is 46.95 [name] is in the action stage of change   ADVISE: [name] was educated on the multiple health risks of obesity as  well as the benefit of weight loss to improve her health. She was advised of the need for long term treatment and the importance of lifestyle modifications.  AGREE: Multiple dietary modification options and treatment options were discussed and  [name] agreed to the above obesity treatment plan.  I, Trixie Dredge, am acting as transcriptionist for Ilene Qua, MD  I have reviewed the above documentation for accuracy and completeness, and I agree with the above. - Ilene Qua, MD

## 2017-03-12 ENCOUNTER — Other Ambulatory Visit (INDEPENDENT_AMBULATORY_CARE_PROVIDER_SITE_OTHER): Payer: Self-pay | Admitting: Family Medicine

## 2017-03-12 DIAGNOSIS — R7303 Prediabetes: Secondary | ICD-10-CM

## 2017-03-12 MED ORDER — METFORMIN HCL 500 MG PO TABS: 500.0000 mg | ORAL_TABLET | Freq: Every day | ORAL | 0 refills | Status: DC

## 2017-03-19 ENCOUNTER — Ambulatory Visit (INDEPENDENT_AMBULATORY_CARE_PROVIDER_SITE_OTHER): Payer: 59 | Admitting: Family Medicine

## 2017-03-19 VITALS — BP 145/83 | HR 86 | Temp 98.5°F | Ht 63.0 in | Wt 266.0 lb

## 2017-03-19 DIAGNOSIS — Z6841 Body Mass Index (BMI) 40.0 and over, adult: Secondary | ICD-10-CM | POA: Diagnosis not present

## 2017-03-19 DIAGNOSIS — R7303 Prediabetes: Secondary | ICD-10-CM

## 2017-03-19 DIAGNOSIS — E559 Vitamin D deficiency, unspecified: Secondary | ICD-10-CM

## 2017-03-20 NOTE — Progress Notes (Signed)
Office: [phone]  /  Fax: [phone]   HPI:   Chief Complaint: OBESITY [name] is here to discuss her progress with her obesity treatment plan. She is on the keep a food journal with 400-500 calories and 35+ grams of protein at supper daily and follow the Category 2 plan + 100 calories and is following her eating plan approximately 100 % of the time. She states she is exercising 0 minutes 0 times per week. Quentin is doing more walking and following plan 100%. She is getting very frustrated that she isn't losing weight.  Her weight is 266 lb (120.7 kg) today and has gained 1 pound since her last visit. She has lost 5 lbs since starting treatment with [place].  Vitamin D Deficiency [name] has a diagnosis of vitamin D deficiency. She is currently taking prescription Vit D and denies nausea, vomiting or muscle weakness.  Pre-Diabetes [name] has a diagnosis of pre-diabetes based on her elevated Hgb A1c and was informed this puts her at greater risk of developing diabetes. She denies GI side effects with metformin and continues to work on diet and exercise to decrease risk of diabetes. She denies polyphagia, polyuria, nausea or hypoglycemia.  Hypertension [name] is a 52 y.o. female with hypertension. Haizlee's blood pressure is slightly elevated today. She denies chest pain, chest pressure, or headache. She is working weight loss to help control her blood pressure with the goal of decreasing her risk of heart attack and stroke. Mackayla's blood pressure is not currently controlled.  ALLERGIES: Allergies  Allergen Reactions  . Contrast Media [Iodinated Diagnostic Agents] Nausea And Vomiting    Pt began vomiting after multihance injection     MEDICATIONS: Current Outpatient Medications on File Prior to Visit  Medication Sig Dispense Refill  . AMLODIPINE BESYLATE PO Take by mouth.    . Ascorbic Acid (VITAMIN C PO) Take by mouth.    .  Azilsartan-Chlorthalidone (EDARBYCLOR PO) Take by mouth.    . Calcium 500-100 MG-UNIT CHEW Chew 1 tablet by mouth 2 (two) times daily.    . meloxicam (MOBIC) 15 MG tablet Take 15 mg by mouth daily as needed for pain.    . metFORMIN (GLUCOPHAGE) 500 MG tablet Take 1 tablet (500 mg total) by mouth daily with breakfast. 30 tablet 0  . Multiple Vitamin (MULTIVITAMIN WITH MINERALS) TABS tablet Take 1 tablet by mouth daily.    . Vitamin D, Ergocalciferol, (DRISDOL) 50000 units CAPS capsule Take 1 capsule (50,000 Units total) by mouth every 7 (seven) days. 4 capsule 0   No current facility-administered medications on file prior to visit.     PAST MEDICAL HISTORY: Past Medical History:  Diagnosis Date  . Anemia   . Constipation   . Gallbladder problem   . History of acute pancreatitis   . Hypertension   . Left shoulder pain   . Low back pain     PAST SURGICAL HISTORY: Past Surgical History:  Procedure Laterality Date  . ABDOMINAL HYSTERECTOMY    . ABDOMINAL HYSTERECTOMY    . CHOLECYSTECTOMY    . PANCREAS SURGERY      SOCIAL HISTORY: Social History   Tobacco Use  . Smoking status: Former Smoker    Last attempt to quit: 01/01/1997    Years since quitting: 20.2  . Smokeless tobacco: Never Used  Substance Use Topics  . Alcohol use: Yes    Comment: occ  . Drug use: Not on file    FAMILY HISTORY: Family History  Problem  Relation Age of Onset  . Hypertension Mother   . Hypertension Father   . Cancer Other     ROS: Review of Systems  Constitutional: Negative for weight loss.  Cardiovascular: Negative for chest pain.       Negative chest pressure  Gastrointestinal: Negative for nausea and vomiting.  Genitourinary: Negative for frequency.  Musculoskeletal:       Negative muscle weakness  Neurological: Negative for headaches.  Endo/Heme/Allergies:       Negative polyphagia Negative hypoglycemia    PHYSICAL EXAM: Blood pressure (!) 145/83, pulse 86, temperature 98.5 F  (36.9 C), temperature source Oral, height 5\' 3"  (1.6 m), weight 266 lb (120.7 kg), SpO2 96 %. Body mass index is 47.12 kg/m. Physical Exam  Constitutional: She is oriented to person, place, and time. She appears well-developed and well-nourished.  Cardiovascular: Normal rate.  Pulmonary/Chest: Effort normal.  Musculoskeletal: Normal range of motion.  Neurological: She is oriented to person, place, and time.  Skin: Skin is warm and dry.  Psychiatric: She has a normal mood and affect. Her behavior is normal.  Vitals reviewed.   RECENT LABS AND TESTS: BMET    Component Value Date/Time   NA 142 01/23/2017 1206   K 4.3 01/23/2017 1206   CL 99 01/23/2017 1206   CO2 29 01/23/2017 1206   GLUCOSE 93 01/23/2017 1206   GLUCOSE 91 08/05/2010 0540   BUN 12 01/23/2017 1206   CREATININE 0.78 01/23/2017 1206   CALCIUM 9.5 01/23/2017 1206   CALCIUM 9.1 08/01/2010 2213   GFRNONAA 88 01/23/2017 1206   GFRAA 102 01/23/2017 1206   Lab Results  Component Value Date   HGBA1C 6.1 (H) 01/23/2017   Lab Results  Component Value Date   INSULIN 21.3 01/23/2017   CBC    Component Value Date/Time   WBC 5.2 01/23/2017 1206   WBC 8.0 08/05/2010 0540   RBC 5.57 (H) 01/23/2017 1206   RBC 4.75 08/05/2010 0540   HGB 13.1 01/23/2017 1206   HCT 39.5 01/23/2017 1206   PLT 156 08/05/2010 0540   MCV 71 (L) 01/23/2017 1206   MCH 23.5 (L) 01/23/2017 1206   MCH 24.4 (L) 08/05/2010 0540   MCHC 33.2 01/23/2017 1206   MCHC 32.1 08/05/2010 0540   RDW 15.8 (H) 01/23/2017 1206   LYMPHSABS 2.4 01/23/2017 1206   MONOABS 0.8 08/03/2010 0526   EOSABS 0.1 01/23/2017 1206   BASOSABS 0.0 01/23/2017 1206   Iron/TIBC/Ferritin/ %Sat No results found for: IRON, TIBC, FERRITIN, IRONPCTSAT Lipid Panel     Component Value Date/Time   CHOL 169 01/23/2017 1206   TRIG 48 01/23/2017 1206   HDL 62 01/23/2017 1206   CHOLHDL 3.1 08/01/2010 2213   VLDL 11 08/01/2010 2213   LDLCALC 97 01/23/2017 1206   Hepatic  Function Panel     Component Value Date/Time   PROT 7.2 01/23/2017 1206   ALBUMIN 4.2 01/23/2017 1206   AST 15 01/23/2017 1206   ALT 17 01/23/2017 1206   ALKPHOS 102 01/23/2017 1206   BILITOT 0.4 01/23/2017 1206   BILIDIR 0.2 08/06/2010 0520   IBILI 0.4 08/06/2010 0520      Component Value Date/Time   TSH 3.320 01/23/2017 1206   TSH 0.997 08/01/2010 2213  Results for JADZIA, IBSEN (MRN [id number]) as of 03/20/2017 09:53  Ref. Range 01/23/2017 12:06  Vitamin D, 25-Hydroxy Latest Ref Range: 30.0 - 100.0 ng/mL 39.0    ASSESSMENT AND PLAN: Vitamin D deficiency  Prediabetes  Class 3 severe obesity  with serious comorbidity and body mass index (BMI) of 45.0 to 49.9 in adult, unspecified obesity type ([place])  PLAN:  Vitamin D Deficiency Ajai was informed that low vitamin D levels contributes to fatigue and are associated with obesity, breast, and colon cancer. [name] agrees to continue taking prescription Vit D @50 ,000 IU every week #4 and will follow up for routine testing of vitamin D, at least 2-3 times per year. She was informed of the risk of over-replacement of vitamin D and agrees to not increase her dose unless she discusses this with [place] first. Eliz agrees to follow up with our clinic in 2 weeks.  Pre-Diabetes [name] will continue to work on weight loss, exercise, and decreasing simple carbohydrates in her diet to help decrease the risk of diabetes. We dicussed metformin including benefits and risks. She was informed that eating too many simple carbohydrates or too many calories at one sitting increases the likelihood of GI side effects. [name] agrees to continue metformin 500 mg PO daily. [name] agrees to follow up with our clinic in 2 weeks as directed to monitor her progress.  Hypertension We discussed sodium restriction, working on healthy weight loss, and a regular exercise program as the means to achieve improved blood pressure control.  [name] agreed with this plan and agreed to follow up as directed. We will continue to monitor her blood pressure as well as her progress with the above lifestyle modifications. She will continue her medications as prescribed and will watch for signs of hypotension as she continues her lifestyle modifications. [name] agrees to follow up with our clinic in 2 weeks.  We spent > than 50% of the 15 minute visit on the counseling as documented in the note.  Obesity [name] is currently in the action stage of change. As such, her goal is to continue with weight loss efforts She has agreed to follow the Category 2 plan + 100 calories [name] has been instructed to work up to a goal of 150 minutes of combined cardio and strengthening exercise per week for weight loss and overall health benefits. We discussed the following Behavioral Modification Strategies today: increasing lean protein intake, increasing vegetables, work on meal planning and easy cooking plans, travel eating strategies, and planning for success Plan to repeat IC if [name] is still not experiencing weight loss.  [name] has agreed to follow up with our clinic in 2 weeks. She was informed of the importance of frequent follow up visits to maximize her success with intensive lifestyle modifications for her multiple health conditions.   OBESITY BEHAVIORAL INTERVENTION VISIT  Today's visit was # 5 out of 22.  Starting weight: 271 lbs Starting date: 01/23/17 Today's weight : 266 lbs Today's date: 03/19/2017 Total lbs lost to date: 5 (Patients must lose 7 lbs in the first 6 months to continue with counseling)   ASK: We discussed the diagnosis of obesity with [name] Fisher today and [name] agreed to give [place] permission to discuss obesity behavioral modification therapy today.  ASSESS: [name] has the diagnosis of obesity and her BMI today is 47.13 [name] is in the action stage of change    ADVISE: Hildegarde was educated on the multiple health risks of obesity as well as the benefit of weight loss to improve her health. She was advised of the need for long term treatment and the importance of lifestyle modifications.  AGREE: Multiple dietary modification options and treatment options were discussed and  [name] agreed to the above obesity treatment plan.  I, Trixie Dredge, am acting as transcriptionist for Ilene Qua, MD  I have reviewed the above documentation for accuracy and completeness, and I agree with the above. - Ilene Qua, MD

## 2017-04-03 ENCOUNTER — Ambulatory Visit (INDEPENDENT_AMBULATORY_CARE_PROVIDER_SITE_OTHER): Payer: 59 | Admitting: Family Medicine

## 2017-04-03 VITALS — BP 134/83 | HR 91 | Temp 98.7°F | Ht 63.0 in | Wt 264.0 lb

## 2017-04-03 DIAGNOSIS — I10 Essential (primary) hypertension: Secondary | ICD-10-CM | POA: Diagnosis not present

## 2017-04-03 DIAGNOSIS — R7303 Prediabetes: Secondary | ICD-10-CM | POA: Diagnosis not present

## 2017-04-03 DIAGNOSIS — Z9189 Other specified personal risk factors, not elsewhere classified: Secondary | ICD-10-CM | POA: Diagnosis not present

## 2017-04-03 DIAGNOSIS — E559 Vitamin D deficiency, unspecified: Secondary | ICD-10-CM

## 2017-04-03 DIAGNOSIS — Z6841 Body Mass Index (BMI) 40.0 and over, adult: Secondary | ICD-10-CM

## 2017-04-03 MED ORDER — VITAMIN D (ERGOCALCIFEROL) 1.25 MG (50000 UNIT) PO CAPS: 50000.0000 [IU] | ORAL_CAPSULE | ORAL | 0 refills | Status: DC

## 2017-04-03 NOTE — Progress Notes (Signed)
Office: [phone]  /  Fax: [phone]   HPI:   Chief Complaint: OBESITY [name] is here to discuss her progress with her obesity treatment plan. She is on the Category 2 plan + 100 calories and is following her eating plan approximately 99 % of the time. She states she is exercising 0 minutes 0 times per week. [name] is working on picking up in terms of walking. Getting back into the swing of eating on plan.  Her weight is 264 lb (119.7 kg) today and has had a weight loss of 2 pounds over a period of 2 weeks since her last visit. She has lost 7 lbs since starting treatment with [place].  Vitamin D Deficiency [name] has a diagnosis of vitamin D deficiency. She is currently taking prescription Vit D. She notes fatigue improving and denies nausea, vomiting or muscle weakness.  At risk for osteopenia and osteoporosis [name] is at higher risk of osteopenia and osteoporosis due to vitamin D deficiency.   Pre-Diabetes [name] has a diagnosis of pre-diabetes based on her elevated Hgb A1c and was informed this puts her at greater risk of developing diabetes. She is doing well on metformin, no GI side effects and continues to work on diet and exercise to decrease risk of diabetes. She denies nausea or hypoglycemia.  Hypertension [name] is a 52 y.o. female with hypertension. Brendy denies chest pain, chest pressure, or headache. She is working weight loss to help control her blood pressure with the goal of decreasing her risk of heart attack and stroke. Kayron's blood pressure is currently controlled.  ALLERGIES: Allergies  Allergen Reactions  . Contrast Media [Iodinated Diagnostic Agents] Nausea And Vomiting    Pt began vomiting after multihance injection     MEDICATIONS: Current Outpatient Medications on File Prior to Visit  Medication Sig Dispense Refill  . AMLODIPINE BESYLATE PO Take by mouth.    . Ascorbic Acid (VITAMIN C PO) Take by mouth.      . Azilsartan-Chlorthalidone (EDARBYCLOR PO) Take by mouth.    . Calcium 500-100 MG-UNIT CHEW Chew 1 tablet by mouth 2 (two) times daily.    . meloxicam (MOBIC) 15 MG tablet Take 15 mg by mouth daily as needed for pain.    . metFORMIN (GLUCOPHAGE) 500 MG tablet Take 1 tablet (500 mg total) by mouth daily with breakfast. 30 tablet 0  . Multiple Vitamin (MULTIVITAMIN WITH MINERALS) TABS tablet Take 1 tablet by mouth daily.    . Vitamin D, Ergocalciferol, (DRISDOL) 50000 units CAPS capsule Take 1 capsule (50,000 Units total) by mouth every 7 (seven) days. 4 capsule 0   No current facility-administered medications on file prior to visit.     PAST MEDICAL HISTORY: Past Medical History:  Diagnosis Date  . Anemia   . Constipation   . Gallbladder problem   . History of acute pancreatitis   . Hypertension   . Left shoulder pain   . Low back pain     PAST SURGICAL HISTORY: Past Surgical History:  Procedure Laterality Date  . ABDOMINAL HYSTERECTOMY    . ABDOMINAL HYSTERECTOMY    . CHOLECYSTECTOMY    . PANCREAS SURGERY      SOCIAL HISTORY: Social History   Tobacco Use  . Smoking status: Former Smoker    Last attempt to quit: 01/01/1997    Years since quitting: 20.2  . Smokeless tobacco: Never Used  Substance Use Topics  . Alcohol use: Yes    Comment: occ  . Drug use:  Not on file    FAMILY HISTORY: Family History  Problem Relation Age of Onset  . Hypertension Mother   . Hypertension Father   . Cancer Other     ROS: Review of Systems  Constitutional: Positive for malaise/fatigue and weight loss.  Cardiovascular: Negative for chest pain.       Negative chest pressure  Gastrointestinal: Negative for nausea and vomiting.  Musculoskeletal:       Negative muscle weakness  Neurological: Negative for headaches.  Endo/Heme/Allergies:       Negative hypoglycemia    PHYSICAL EXAM: Blood pressure 134/83, pulse 91, temperature 98.7 F (37.1 C), temperature source Oral, height  5\' 3"  (1.6 m), weight 264 lb (119.7 kg), SpO2 95 %. Body mass index is 46.77 kg/m. Physical Exam  Constitutional: She is oriented to person, place, and time. She appears well-developed and well-nourished.  Cardiovascular: Normal rate.  Pulmonary/Chest: Effort normal.  Musculoskeletal: Normal range of motion.  Neurological: She is oriented to person, place, and time.  Skin: Skin is warm and dry.  Psychiatric: She has a normal mood and affect. Her behavior is normal.  Vitals reviewed.   RECENT LABS AND TESTS: BMET    Component Value Date/Time   NA 142 01/23/2017 1206   K 4.3 01/23/2017 1206   CL 99 01/23/2017 1206   CO2 29 01/23/2017 1206   GLUCOSE 93 01/23/2017 1206   GLUCOSE 91 08/05/2010 0540   BUN 12 01/23/2017 1206   CREATININE 0.78 01/23/2017 1206   CALCIUM 9.5 01/23/2017 1206   CALCIUM 9.1 08/01/2010 2213   GFRNONAA 88 01/23/2017 1206   GFRAA 102 01/23/2017 1206   Lab Results  Component Value Date   HGBA1C 6.1 (H) 01/23/2017   Lab Results  Component Value Date   INSULIN 21.3 01/23/2017   CBC    Component Value Date/Time   WBC 5.2 01/23/2017 1206   WBC 8.0 08/05/2010 0540   RBC 5.57 (H) 01/23/2017 1206   RBC 4.75 08/05/2010 0540   HGB 13.1 01/23/2017 1206   HCT 39.5 01/23/2017 1206   PLT 156 08/05/2010 0540   MCV 71 (L) 01/23/2017 1206   MCH 23.5 (L) 01/23/2017 1206   MCH 24.4 (L) 08/05/2010 0540   MCHC 33.2 01/23/2017 1206   MCHC 32.1 08/05/2010 0540   RDW 15.8 (H) 01/23/2017 1206   LYMPHSABS 2.4 01/23/2017 1206   MONOABS 0.8 08/03/2010 0526   EOSABS 0.1 01/23/2017 1206   BASOSABS 0.0 01/23/2017 1206   Iron/TIBC/Ferritin/ %Sat No results found for: IRON, TIBC, FERRITIN, IRONPCTSAT Lipid Panel     Component Value Date/Time   CHOL 169 01/23/2017 1206   TRIG 48 01/23/2017 1206   HDL 62 01/23/2017 1206   CHOLHDL 3.1 08/01/2010 2213   VLDL 11 08/01/2010 2213   LDLCALC 97 01/23/2017 1206   Hepatic Function Panel     Component Value Date/Time    PROT 7.2 01/23/2017 1206   ALBUMIN 4.2 01/23/2017 1206   AST 15 01/23/2017 1206   ALT 17 01/23/2017 1206   ALKPHOS 102 01/23/2017 1206   BILITOT 0.4 01/23/2017 1206   BILIDIR 0.2 08/06/2010 0520   IBILI 0.4 08/06/2010 0520      Component Value Date/Time   TSH 3.320 01/23/2017 1206   TSH 0.997 08/01/2010 2213  Results for MAHREEN, SCHEWE (MRN [id number]) as of 04/03/2017 10:22  Ref. Range 01/23/2017 12:06  Vitamin D, 25-Hydroxy Latest Ref Range: 30.0 - 100.0 ng/mL 39.0    ASSESSMENT AND PLAN: Prediabetes  Vitamin D  deficiency - Plan: Vitamin D, Ergocalciferol, (DRISDOL) 50000 units CAPS capsule  Essential hypertension  At risk for osteoporosis  Class 3 severe obesity with serious comorbidity and body mass index (BMI) of 45.0 to 49.9 in adult, unspecified obesity type (HCC)  PLAN:  Vitamin D Deficiency Miri was informed that low vitamin D levels contributes to fatigue and are associated with obesity, breast, and colon cancer. Antwoiniette agrees to continue taking prescription Vit D @50 ,000 IU every week #4 with no refills. She will follow up for routine testing of vitamin D, at least 2-3 times per year. She was informed of the risk of over-replacement of vitamin D and agrees to not increase her dose unless she discusses this with [place] first. Robby agrees to follow up with our clinic in 2 weeks.  At risk for osteopenia and osteoporosis Grisel is at risk for osteopenia and osteoporsis due to her vitamin D deficiency. She was encouraged to take her vitamin D and follow her higher calcium diet and increase strengthening exercise to help strengthen her bones and decrease her risk of osteopenia and osteoporosis.  Pre-Diabetes Presleigh will continue to work on weight loss, exercise, and decreasing simple carbohydrates in her diet to help decrease the risk of diabetes. We dicussed metformin including benefits and risks. She was informed that eating too many simple  carbohydrates or too many calories at one sitting increases the likelihood of GI side effects. Diora agrees to continue taking metformin 500 mg PO q AM, no refills needed. Kiyla agrees to follow up with our clinic in 2 weeks as directed to monitor her progress.  Hypertension We discussed sodium restriction, working on healthy weight loss, and a regular exercise program as the means to achieve improved blood pressure control. Gweneth agreed with this plan and agreed to follow up as directed. We will continue to monitor her blood pressure as well as her progress with the above lifestyle modifications. Shalissa agrees to continue taking amlodipine and Edarbyclor as prescribed and will watch for signs of hypotension as she continues her lifestyle modifications. Mahogany agrees to follow up with our clinic in 2 weeks.  Obesity Sherisse is currently in the action stage of change. As such, her goal is to continue with weight loss efforts She has agreed to follow the Category 3 plan Laquida has been instructed to work up to a goal of 150 minutes of combined cardio and strengthening exercise per week for weight loss and overall health benefits. We discussed the following Behavioral Modification Strategies today: increasing lean protein intake, increasing vegetables, work on meal planning and easy cooking plans, better snacking choices, and planning for success   Arva has agreed to follow up with our clinic in 2 weeks. She was informed of the importance of frequent follow up visits to maximize her success with intensive lifestyle modifications for her multiple health conditions.   OBESITY BEHAVIORAL INTERVENTION VISIT  Today's visit was # 6 out of 22.  Starting weight: 271 lbs Starting date: 01/23/17 Today's weight : 264 lbs  Today's date: 04/03/2017 Total lbs lost to date: 7 (Patients must lose 7 lbs in the first 6 months to continue with counseling)   ASK: We  discussed the diagnosis of obesity with [name] today and [name] agreed to give [place] permission to discuss obesity behavioral modification therapy today.  ASSESS: [name] has the diagnosis of obesity and her BMI today is 46.78 [name] is in the action stage of change   ADVISE: [name] was educated on the  multiple health risks of obesity as well as the benefit of weight loss to improve her health. She was advised of the need for long term treatment and the importance of lifestyle modifications.  AGREE: Multiple dietary modification options and treatment options were discussed and  Fredericka agreed to the above obesity treatment plan.  Wilhemena Durie, am acting as transcriptionist for Ilene Qua, MD

## 2017-04-17 ENCOUNTER — Ambulatory Visit (INDEPENDENT_AMBULATORY_CARE_PROVIDER_SITE_OTHER): Payer: 59 | Admitting: Family Medicine

## 2017-04-17 VITALS — BP 128/86 | HR 94 | Temp 98.0°F | Ht 63.0 in | Wt 263.0 lb

## 2017-04-17 DIAGNOSIS — Z9189 Other specified personal risk factors, not elsewhere classified: Secondary | ICD-10-CM | POA: Diagnosis not present

## 2017-04-17 DIAGNOSIS — E559 Vitamin D deficiency, unspecified: Secondary | ICD-10-CM

## 2017-04-17 DIAGNOSIS — I10 Essential (primary) hypertension: Secondary | ICD-10-CM | POA: Diagnosis not present

## 2017-04-17 DIAGNOSIS — Z6841 Body Mass Index (BMI) 40.0 and over, adult: Secondary | ICD-10-CM

## 2017-04-17 DIAGNOSIS — D509 Iron deficiency anemia, unspecified: Secondary | ICD-10-CM

## 2017-04-17 DIAGNOSIS — R7303 Prediabetes: Secondary | ICD-10-CM | POA: Diagnosis not present

## 2017-04-17 MED ORDER — METFORMIN HCL 500 MG PO TABS: 500.0000 mg | ORAL_TABLET | Freq: Every day | ORAL | 0 refills | Status: DC

## 2017-04-17 MED ORDER — VITAMIN D (ERGOCALCIFEROL) 1.25 MG (50000 UNIT) PO CAPS: 50000.0000 [IU] | ORAL_CAPSULE | ORAL | 0 refills | Status: DC

## 2017-04-17 NOTE — Progress Notes (Signed)
Office: [phone]  /  Fax: [phone]   HPI:   Chief Complaint: OBESITY [name] is here to discuss her progress with her obesity treatment plan. She is on the Category 3 plan and is following her eating plan approximately 90 % of the time. She states she is doing bond exercise for 10 to 15 minutes 7 times per week. [name] went to [place] week for her nephew's field trip and did lots of walking. Her weight is 263 lb (119.3 kg) today and has had a weight loss of 1 pound over a period of 2 weeks since her last visit. She has lost 8 lbs since starting treatment with [place].  Vitamin D deficiency [name] has a diagnosis of vitamin D deficiency. She is currently taking vit D and denies nausea, vomiting or muscle weakness.  At risk for osteopenia and osteoporosis [name] is at higher risk of osteopenia and osteoporosis due to vitamin D deficiency.   Hypertension [name] is a 52 y.o. female with hypertension. Thekla Norcia denies dizziness or lightheadedness. She is working weight loss to help control her blood pressure with the goal of decreasing her risk of heart attack and stroke. [name] blood pressure is currently controlled.  Pre-Diabetes [name] has a diagnosis of pre-diabetes based on her last Hgb A1c of 6.1 and fasting insulin of 21.3. She was informed this puts her at greater risk of developing diabetes. She is taking metformin currently and continues to work on diet and exercise to decrease risk of diabetes. She denies nausea or hypoglycemia.  Anemia (Microcytic) [name] has a diagnosis of anemia. She has a MCV of 71 and RDW of 15.8. She notes fatigue and is not on iron supplementation.   ALLERGIES: Allergies  Allergen Reactions  . Contrast Media [Iodinated Diagnostic Agents] Nausea And Vomiting    Pt began vomiting after multihance injection     MEDICATIONS: Current Outpatient Medications on File Prior to Visit    Medication Sig Dispense Refill  . AMLODIPINE BESYLATE PO Take by mouth.    . Ascorbic Acid (VITAMIN C PO) Take by mouth.    . Azilsartan-Chlorthalidone (EDARBYCLOR PO) Take by mouth.    . Calcium 500-100 MG-UNIT CHEW Chew 1 tablet by mouth 2 (two) times daily.    . meloxicam (MOBIC) 15 MG tablet Take 15 mg by mouth daily as needed for pain.    . Multiple Vitamin (MULTIVITAMIN WITH MINERALS) TABS tablet Take 1 tablet by mouth daily.     No current facility-administered medications on file prior to visit.     PAST MEDICAL HISTORY: Past Medical History:  Diagnosis Date  . Anemia   . Constipation   . Gallbladder problem   . History of acute pancreatitis   . Hypertension   . Left shoulder pain   . Low back pain     PAST SURGICAL HISTORY: Past Surgical History:  Procedure Laterality Date  . ABDOMINAL HYSTERECTOMY    . ABDOMINAL HYSTERECTOMY    . CHOLECYSTECTOMY    . PANCREAS SURGERY      SOCIAL HISTORY: Social History   Tobacco Use  . Smoking status: Former Smoker    Last attempt to quit: 01/01/1997    Years since quitting: 20.3  . Smokeless tobacco: Never Used  Substance Use Topics  . Alcohol use: Yes    Comment: occ  . Drug use: Not on file    FAMILY HISTORY: Family History  Problem Relation Age of Onset  . Hypertension Mother   .  Hypertension Father   . Cancer Other     ROS: Review of Systems  Constitutional: Positive for weight loss.  Gastrointestinal: Negative for nausea and vomiting.  Musculoskeletal:       Negative for muscle weakness  Neurological: Negative for dizziness.       Negative for lightheadedness  Endo/Heme/Allergies:       Negative for hypoglycemia    PHYSICAL EXAM: Blood pressure 128/86, pulse 94, temperature 98 F (36.7 C), temperature source Oral, height 5\' 3"  (1.6 m), weight 263 lb (119.3 kg), SpO2 94 %. Body mass index is 46.59 kg/m. Physical Exam  Constitutional: She is oriented to person, place, and time. She appears  well-developed and well-nourished.  Cardiovascular: Normal rate.  Pulmonary/Chest: Effort normal.  Musculoskeletal: Normal range of motion.  Neurological: She is oriented to person, place, and time.  Skin: Skin is warm and dry.  Psychiatric: She has a normal mood and affect. Her behavior is normal.  Vitals reviewed.   RECENT LABS AND TESTS: BMET    Component Value Date/Time   NA 142 01/23/2017 1206   K 4.3 01/23/2017 1206   CL 99 01/23/2017 1206   CO2 29 01/23/2017 1206   GLUCOSE 93 01/23/2017 1206   GLUCOSE 91 08/05/2010 0540   BUN 12 01/23/2017 1206   CREATININE 0.78 01/23/2017 1206   CALCIUM 9.5 01/23/2017 1206   CALCIUM 9.1 08/01/2010 2213   GFRNONAA 88 01/23/2017 1206   GFRAA 102 01/23/2017 1206   Lab Results  Component Value Date   HGBA1C 6.1 (H) 01/23/2017   Lab Results  Component Value Date   INSULIN 21.3 01/23/2017   CBC    Component Value Date/Time   WBC 5.2 01/23/2017 1206   WBC 8.0 08/05/2010 0540   RBC 5.57 (H) 01/23/2017 1206   RBC 4.75 08/05/2010 0540   HGB 13.1 01/23/2017 1206   HCT 39.5 01/23/2017 1206   PLT 156 08/05/2010 0540   MCV 71 (L) 01/23/2017 1206   MCH 23.5 (L) 01/23/2017 1206   MCH 24.4 (L) 08/05/2010 0540   MCHC 33.2 01/23/2017 1206   MCHC 32.1 08/05/2010 0540   RDW 15.8 (H) 01/23/2017 1206   LYMPHSABS 2.4 01/23/2017 1206   MONOABS 0.8 08/03/2010 0526   EOSABS 0.1 01/23/2017 1206   BASOSABS 0.0 01/23/2017 1206   Iron/TIBC/Ferritin/ %Sat No results found for: IRON, TIBC, FERRITIN, IRONPCTSAT Lipid Panel     Component Value Date/Time   CHOL 169 01/23/2017 1206   TRIG 48 01/23/2017 1206   HDL 62 01/23/2017 1206   CHOLHDL 3.1 08/01/2010 2213   VLDL 11 08/01/2010 2213   LDLCALC 97 01/23/2017 1206   Hepatic Function Panel     Component Value Date/Time   PROT 7.2 01/23/2017 1206   ALBUMIN 4.2 01/23/2017 1206   AST 15 01/23/2017 1206   ALT 17 01/23/2017 1206   ALKPHOS 102 01/23/2017 1206   BILITOT 0.4 01/23/2017 1206    BILIDIR 0.2 08/06/2010 0520   IBILI 0.4 08/06/2010 0520      Component Value Date/Time   TSH 3.320 01/23/2017 1206   TSH 0.997 08/01/2010 2213   Results for MISAKI, SOZIO (MRN [id number]) as of 04/17/2017 17:59  Ref. Range 01/23/2017 12:06  Vitamin D, 25-Hydroxy Latest Ref Range: 30.0 - 100.0 ng/mL 39.0   ASSESSMENT AND PLAN: Essential hypertension  Vitamin D deficiency - Plan: Vitamin D, Ergocalciferol, (DRISDOL) 50000 units CAPS capsule  Prediabetes - Plan: metFORMIN (GLUCOPHAGE) 500 MG tablet  Microcytic anemia  At risk for osteoporosis  Class 3 severe obesity with serious comorbidity and body mass index (BMI) of 45.0 to 49.9 in adult, unspecified obesity type ([facility])  PLAN:  Vitamin D Deficiency [name] was informed that low vitamin D levels contributes to fatigue and are associated with obesity, breast, and colon cancer. She agrees to continue to take prescription Vit D @50 ,000 IU every week #4 with no refills and will follow up for routine testing of vitamin D, at least 2-3 times per year. She was informed of the risk of over-replacement of vitamin D and agrees to not increase her dose unless she discusses this with [place] first. [name] agrees to follow up with our clinic in 2 weeks.  At risk for osteopenia and osteoporosis [name] is at risk for osteopenia and osteoporosis due to her vitamin D deficiency. She was encouraged to take her vitamin D and follow her higher calcium diet and increase strengthening exercise to help strengthen her bones and decrease her risk of osteopenia and osteoporosis.  Hypertension We discussed sodium restriction, working on healthy weight loss, and a regular exercise program as the means to achieve improved blood pressure control. [name] agreed with this plan and agreed to follow up as directed. We will continue to monitor her blood pressure as well as her progress with the above lifestyle modifications. She will continue her  medications as prescribed and will watch for signs of hypotension as she continues her lifestyle modifications.  Pre-Diabetes [name] will continue to work on weight loss, exercise, and decreasing simple carbohydrates in her diet to help decrease the risk of diabetes. We dicussed metformin including benefits and risks. She was informed that eating too many simple carbohydrates or too many calories at one sitting increases the likelihood of GI side effects. [name] agrees to continue metformin for now and a prescription was written today for metformin 500 mg qAM #30 with no refills. [name] agreed to follow up with [place] as directed to monitor her progress.  Anemia (Microcytic) The diagnosis of microcytic anemia was discussed with [name] and was explained in detail. She was given suggestions of iron rich foods and iron supplement was not prescribed. We will check anemia panel at her next visit.   Obesity [name] is currently in the action stage of change. As such, her goal is to continue with weight loss efforts She has agreed to follow the Category 3 plan [name] has been instructed to work up to a goal of 150 minutes of combined cardio and strengthening exercise per week or add an additional day of resistance training for weight loss and overall health benefits. We discussed the following Behavioral Modification Strategies today: planning for success, better snacking choices, increasing lean protein intake, increasing vegetables and work on meal planning and easy cooking plans  [name] has agreed to follow up with our clinic in 2 weeks. She was informed of the importance of frequent follow up visits to maximize her success with intensive lifestyle modifications for her multiple health conditions.   OBESITY BEHAVIORAL INTERVENTION VISIT  Today's visit was # 7 out of 22.  Starting weight: 271 lbs Starting date: 01/23/17 Today's weight : 263 lbs  Today's date:  04/17/2017 Total lbs lost to date: 8 (Patients must lose 7 lbs in the first 6 months to continue with counseling)   ASK: We discussed the diagnosis of obesity with [name] today and [name] agreed to give [place] permission to discuss obesity behavioral modification therapy today.  ASSESS: [name] has the diagnosis of obesity and her BMI  today is 46.6 [name] is in the action stage of change   ADVISE: [name] was educated on the multiple health risks of obesity as well as the benefit of weight loss to improve her health. She was advised of the need for long term treatment and the importance of lifestyle modifications.  AGREE: Multiple dietary modification options and treatment options were discussed and  Maleigha agreed to the above obesity treatment plan.  I, Doreene Nest, am acting as transcriptionist for Eber Jones, MD  I have reviewed the above documentation for accuracy and completeness, and I agree with the above. - Ilene Qua, MD

## 2017-04-30 ENCOUNTER — Ambulatory Visit (INDEPENDENT_AMBULATORY_CARE_PROVIDER_SITE_OTHER): Payer: 59 | Admitting: Family Medicine

## 2017-04-30 VITALS — BP 149/79 | HR 86 | Temp 98.2°F | Ht 63.0 in | Wt 265.0 lb

## 2017-04-30 DIAGNOSIS — E559 Vitamin D deficiency, unspecified: Secondary | ICD-10-CM

## 2017-04-30 DIAGNOSIS — Z6841 Body Mass Index (BMI) 40.0 and over, adult: Secondary | ICD-10-CM | POA: Diagnosis not present

## 2017-04-30 DIAGNOSIS — I10 Essential (primary) hypertension: Secondary | ICD-10-CM | POA: Diagnosis not present

## 2017-04-30 DIAGNOSIS — Z9189 Other specified personal risk factors, not elsewhere classified: Secondary | ICD-10-CM | POA: Diagnosis not present

## 2017-04-30 MED ORDER — VITAMIN D (ERGOCALCIFEROL) 1.25 MG (50000 UNIT) PO CAPS: 50000.0000 [IU] | ORAL_CAPSULE | ORAL | 0 refills | Status: DC

## 2017-05-01 NOTE — Progress Notes (Signed)
Office: [phone]  /  Fax: [phone]   HPI:   Chief Complaint: OBESITY [name] is here to discuss her progress with her obesity treatment plan. She is on the Category 3 plan and is following her eating plan approximately 98 % of the time. She states she is exercising 10 to 15 minutes 3 times per week. Comfort is planning a trip to [place] for her nephew's graduation. Her eating is spot on and she struggled with the lack of weight loss. She's not sure what the problem is. Her weight is 265 lb (120.2 kg) today and has had a weight gain of 2 pounds over a period of 2 weeks since her last visit. She has lost 6 lbs since starting treatment with [place].  Vitamin D deficiency [name] has a diagnosis of vitamin D deficiency. She is currently taking vit D and denies nausea, vomiting or muscle weakness.  At risk for osteopenia and osteoporosis [name] is at higher risk of osteopenia and osteoporosis due to vitamin D deficiency.   Hypertension [name] is a 52 y.o. female with hypertension. Her blood pressure is slightly elevated today. She did not take her amlodipine the last few nights. [name] denies chest pain or shortness of breath on exertion. She is working weight loss to help control her blood pressure with the goal of decreasing her risk of heart attack and stroke. [name] blood pressure is not currently controlled.  ALLERGIES: Allergies  Allergen Reactions  . Contrast Media [Iodinated Diagnostic Agents] Nausea And Vomiting    Pt began vomiting after multihance injection     MEDICATIONS: Current Outpatient Medications on File Prior to Visit  Medication Sig Dispense Refill  . AMLODIPINE BESYLATE PO Take by mouth.    . Ascorbic Acid (VITAMIN C PO) Take by mouth.    . Azilsartan-Chlorthalidone (EDARBYCLOR PO) Take by mouth.    . Calcium 500-100 MG-UNIT CHEW Chew 1 tablet by mouth 2 (two) times daily.    . meloxicam (MOBIC) 15 MG tablet Take  15 mg by mouth daily as needed for pain.    . metFORMIN (GLUCOPHAGE) 500 MG tablet Take 1 tablet (500 mg total) by mouth daily with breakfast. 30 tablet 0  . Multiple Vitamin (MULTIVITAMIN WITH MINERALS) TABS tablet Take 1 tablet by mouth daily.     No current facility-administered medications on file prior to visit.     PAST MEDICAL HISTORY: Past Medical History:  Diagnosis Date  . Anemia   . Constipation   . Gallbladder problem   . History of acute pancreatitis   . Hypertension   . Left shoulder pain   . Low back pain     PAST SURGICAL HISTORY: Past Surgical History:  Procedure Laterality Date  . ABDOMINAL HYSTERECTOMY    . ABDOMINAL HYSTERECTOMY    . CHOLECYSTECTOMY    . PANCREAS SURGERY      SOCIAL HISTORY: Social History   Tobacco Use  . Smoking status: Former Smoker    Last attempt to quit: 01/01/1997    Years since quitting: 20.3  . Smokeless tobacco: Never Used  Substance Use Topics  . Alcohol use: Yes    Comment: occ  . Drug use: Not on file    FAMILY HISTORY: Family History  Problem Relation Age of Onset  . Hypertension Mother   . Hypertension Father   . Cancer Other     ROS: Review of Systems  Constitutional: Negative for weight loss.  Respiratory: Negative for shortness of breath (on  exertion).   Cardiovascular: Negative for chest pain.  Gastrointestinal: Negative for nausea and vomiting.  Musculoskeletal:       Negative for muscle weakness    PHYSICAL EXAM: Blood pressure (!) 149/79, pulse 86, temperature 98.2 F (36.8 C), height 5\' 3"  (1.6 m), weight 265 lb (120.2 kg), SpO2 98 %. Body mass index is 46.94 kg/m. Physical Exam  Constitutional: She is oriented to person, place, and time. She appears well-developed and well-nourished.  Cardiovascular: Normal rate.  Pulmonary/Chest: Effort normal.  Musculoskeletal: Normal range of motion.  Neurological: She is oriented to person, place, and time.  Skin: Skin is warm and dry.  Psychiatric:  She has a normal mood and affect. Her behavior is normal.  Vitals reviewed.   RECENT LABS AND TESTS: BMET    Component Value Date/Time   NA 142 01/23/2017 1206   K 4.3 01/23/2017 1206   CL 99 01/23/2017 1206   CO2 29 01/23/2017 1206   GLUCOSE 93 01/23/2017 1206   GLUCOSE 91 08/05/2010 0540   BUN 12 01/23/2017 1206   CREATININE 0.78 01/23/2017 1206   CALCIUM 9.5 01/23/2017 1206   CALCIUM 9.1 08/01/2010 2213   GFRNONAA 88 01/23/2017 1206   GFRAA 102 01/23/2017 1206   Lab Results  Component Value Date   HGBA1C 6.1 (H) 01/23/2017   Lab Results  Component Value Date   INSULIN 21.3 01/23/2017   CBC    Component Value Date/Time   WBC 5.2 01/23/2017 1206   WBC 8.0 08/05/2010 0540   RBC 5.57 (H) 01/23/2017 1206   RBC 4.75 08/05/2010 0540   HGB 13.1 01/23/2017 1206   HCT 39.5 01/23/2017 1206   PLT 156 08/05/2010 0540   MCV 71 (L) 01/23/2017 1206   MCH 23.5 (L) 01/23/2017 1206   MCH 24.4 (L) 08/05/2010 0540   MCHC 33.2 01/23/2017 1206   MCHC 32.1 08/05/2010 0540   RDW 15.8 (H) 01/23/2017 1206   LYMPHSABS 2.4 01/23/2017 1206   MONOABS 0.8 08/03/2010 0526   EOSABS 0.1 01/23/2017 1206   BASOSABS 0.0 01/23/2017 1206   Iron/TIBC/Ferritin/ %Sat No results found for: IRON, TIBC, FERRITIN, IRONPCTSAT Lipid Panel     Component Value Date/Time   CHOL 169 01/23/2017 1206   TRIG 48 01/23/2017 1206   HDL 62 01/23/2017 1206   CHOLHDL 3.1 08/01/2010 2213   VLDL 11 08/01/2010 2213   LDLCALC 97 01/23/2017 1206   Hepatic Function Panel     Component Value Date/Time   PROT 7.2 01/23/2017 1206   ALBUMIN 4.2 01/23/2017 1206   AST 15 01/23/2017 1206   ALT 17 01/23/2017 1206   ALKPHOS 102 01/23/2017 1206   BILITOT 0.4 01/23/2017 1206   BILIDIR 0.2 08/06/2010 0520   IBILI 0.4 08/06/2010 0520      Component Value Date/Time   TSH 3.320 01/23/2017 1206   TSH 0.997 08/01/2010 2213   Results for [name], [name] (MRN [id number]) as of 05/01/2017 17:50  Ref. Range 01/23/2017 12:06   Vitamin D, 25-Hydroxy Latest Ref Range: 30.0 - 100.0 ng/mL 39.0   ASSESSMENT AND PLAN: Vitamin D deficiency - Plan: Vitamin D, Ergocalciferol, (DRISDOL) 50000 units CAPS capsule  Essential hypertension  At risk for osteoporosis  Class 3 severe obesity with serious comorbidity and body mass index (BMI) of 45.0 to 49.9 in adult, unspecified obesity type (HCC)  PLAN:  Vitamin D Deficiency [name] was informed that low vitamin D levels contributes to fatigue and are associated with obesity, breast, and colon cancer. She agrees to  continue to take prescription Vit D @50 ,000 IU every week #4 with no refills and will follow up for routine testing of vitamin D, at least 2-3 times per year. She was informed of the risk of over-replacement of vitamin D and agrees to not increase her dose unless she discusses this with [place] first. [name] agrees to follow up with our clinic in 3 weeks.  At risk for osteopenia and osteoporosis [name] is at risk for osteopenia and osteoporosis due to her vitamin D deficiency. She was encouraged to take her vitamin D and follow her higher calcium diet and increase strengthening exercise to help strengthen her bones and decrease her risk of osteopenia and osteoporosis.  Hypertension We discussed sodium restriction, working on healthy weight loss, and a regular exercise program as the means to achieve improved blood pressure control. [name] agreed with this plan and agreed to follow up as directed. We will follow up at the next visit and will continue to monitor her blood pressure as well as her progress with the above lifestyle modifications. [name] was encouraged to take her blood pressure medications as prescribed and will watch for signs of hypotension as she continues her lifestyle modifications.  Obesity [name] is currently in the action stage of change. As such, her goal is to continue with weight loss efforts She has agreed to keep a food  journal with 1300 to 1400 calories and 80 grams of protein daily [name] has been instructed to work up to a goal of 150 minutes of combined cardio and strengthening exercise per week for weight loss and overall health benefits. We discussed the following Behavioral Modification Strategies today: planning for success, keep a strict food journal, increasing lean protein intake and work on meal planning and easy cooking plans  [name] has agreed to follow up with our clinic in 3 weeks. She was informed of the importance of frequent follow up visits to maximize her success with intensive lifestyle modifications for her multiple health conditions.   OBESITY BEHAVIORAL INTERVENTION VISIT  Today's visit was # 8 out of 22.  Starting weight: 271 lbs Starting date: 01/23/17 Today's weight : 265 lbs  Today's date: 04/30/2017 Total lbs lost to date: 6 (Patients must lose 7 lbs in the first 6 months to continue with counseling)   ASK: We discussed the diagnosis of obesity with [name] today and [name] agreed to give [place] permission to discuss obesity behavioral modification therapy today.  ASSESS: Meryem has the diagnosis of obesity and her BMI today is 46.95 Korrine is in the action stage of change   ADVISE: Codee was educated on the multiple health risks of obesity as well as the benefit of weight loss to improve her health. She was advised of the need for long term treatment and the importance of lifestyle modifications.  AGREE: Multiple dietary modification options and treatment options were discussed and  Murphy agreed to the above obesity treatment plan.  I, Doreene Nest, am acting as transcriptionist for Eber Jones, MD  I have reviewed the above documentation for accuracy and completeness, and I agree with the above. - Ilene Qua, MD

## 2017-05-14 ENCOUNTER — Ambulatory Visit (INDEPENDENT_AMBULATORY_CARE_PROVIDER_SITE_OTHER): Payer: 59

## 2017-05-14 ENCOUNTER — Ambulatory Visit: Payer: 59 | Admitting: Podiatry

## 2017-05-14 DIAGNOSIS — Z79899 Other long term (current) drug therapy: Secondary | ICD-10-CM | POA: Diagnosis not present

## 2017-05-14 DIAGNOSIS — M84375A Stress fracture, left foot, initial encounter for fracture: Secondary | ICD-10-CM | POA: Diagnosis not present

## 2017-05-14 DIAGNOSIS — M7750 Other enthesopathy of unspecified foot: Secondary | ICD-10-CM | POA: Diagnosis not present

## 2017-05-14 DIAGNOSIS — M779 Enthesopathy, unspecified: Secondary | ICD-10-CM

## 2017-05-14 DIAGNOSIS — J301 Allergic rhinitis due to pollen: Secondary | ICD-10-CM | POA: Diagnosis not present

## 2017-05-14 DIAGNOSIS — I10 Essential (primary) hypertension: Secondary | ICD-10-CM | POA: Diagnosis not present

## 2017-05-14 DIAGNOSIS — R7303 Prediabetes: Secondary | ICD-10-CM | POA: Diagnosis not present

## 2017-05-14 LAB — CBC WITH DIFFERENTIAL/PLATELET
Basophils Absolute: 13 cells/uL (ref 0–200)
Basophils Relative: 0.2 %
EOS ABS: 39 {cells}/uL (ref 15–500)
Eosinophils Relative: 0.6 %
HEMATOCRIT: 41.6 % (ref 35.0–45.0)
HEMOGLOBIN: 13.7 g/dL (ref 11.7–15.5)
LYMPHS ABS: 2581 {cells}/uL (ref 850–3900)
MCH: 23.7 pg — AB (ref 27.0–33.0)
MCHC: 32.9 g/dL (ref 32.0–36.0)
MCV: 71.8 fL — AB (ref 80.0–100.0)
MONOS PCT: 6.6 %
MPV: 11.1 fL (ref 7.5–12.5)
NEUTROS ABS: 3439 {cells}/uL (ref 1500–7800)
Neutrophils Relative %: 52.9 %
Platelets: 146 10*3/uL (ref 140–400)
RBC: 5.79 10*6/uL — ABNORMAL HIGH (ref 3.80–5.10)
RDW: 15.1 % — ABNORMAL HIGH (ref 11.0–15.0)
Total Lymphocyte: 39.7 %
WBC: 6.5 10*3/uL (ref 3.8–10.8)
WBCMIX: 429 {cells}/uL (ref 200–950)

## 2017-05-14 LAB — HEPATIC FUNCTION PANEL
AG RATIO: 1.5 (calc) (ref 1.0–2.5)
ALT: 13 U/L (ref 6–29)
AST: 13 U/L (ref 10–35)
Albumin: 4.4 g/dL (ref 3.6–5.1)
Alkaline phosphatase (APISO): 91 U/L (ref 33–130)
BILIRUBIN TOTAL: 0.6 mg/dL (ref 0.2–1.2)
Bilirubin, Direct: 0.1 mg/dL (ref 0.0–0.2)
GLOBULIN: 3 g/dL (ref 1.9–3.7)
Indirect Bilirubin: 0.5 mg/dL (calc) (ref 0.2–1.2)
TOTAL PROTEIN: 7.4 g/dL (ref 6.1–8.1)

## 2017-05-14 NOTE — Patient Instructions (Signed)

## 2017-05-15 ENCOUNTER — Telehealth: Payer: Self-pay | Admitting: *Deleted

## 2017-05-15 MED ORDER — TERBINAFINE HCL 250 MG PO TABS: 250.0000 mg | ORAL_TABLET | Freq: Every day | ORAL | 0 refills | Status: DC

## 2017-05-15 NOTE — Telephone Encounter (Signed)
I informed pt of Dr. Leigh Aurora review of results and orders. Pt states she has an appt 05/31/2017. I reviewed clinicals and she is to be rechecked for fracture at that time, and I told her she could make her 4 week lamisil appt at her next visit. Pt states understanding.

## 2017-05-15 NOTE — Progress Notes (Signed)
Subjective:   Patient ID: [name], female   DOB: 52 y.o.   MRN: [id number]   HPI 52 year old female presents the office today for concerns of pain to the lateral aspect the left foot is been ongoing for 2 weeks.  She describes a sudden onset of pain but she denies any recent injury or trauma to the area.  She says it hurts to put pressure and walk to the area.  She has noticed some mild swelling but no redness or warmth.  She has normal to no pain to the right foot.  She also states that she has thick, discolored toenails and concern for fungus.  She also has some athlete's foot as well.  Denies any open sores.  Has tried over-the-counter medicine for this.  This is been ongoing for more than 6 months.  She has no other concerns.   Review of Systems  All other systems reviewed and are negative.  Past Medical History:  Diagnosis Date  . Anemia   . Constipation   . Gallbladder problem   . History of acute pancreatitis   . Hypertension   . Left shoulder pain   . Low back pain     Past Surgical History:  Procedure Laterality Date  . ABDOMINAL HYSTERECTOMY    . ABDOMINAL HYSTERECTOMY    . CHOLECYSTECTOMY    . PANCREAS SURGERY       Current Outpatient Medications:  .  AMLODIPINE BESYLATE PO, Take by mouth., Disp: , Rfl:  .  Ascorbic Acid (VITAMIN C PO), Take by mouth., Disp: , Rfl:  .  Azilsartan-Chlorthalidone (EDARBYCLOR PO), Take by mouth., Disp: , Rfl:  .  Calcium 500-100 MG-UNIT CHEW, Chew 1 tablet by mouth 2 (two) times daily., Disp: , Rfl:  .  meloxicam (MOBIC) 15 MG tablet, Take 15 mg by mouth daily as needed for pain., Disp: , Rfl:  .  metFORMIN (GLUCOPHAGE) 500 MG tablet, Take 1 tablet (500 mg total) by mouth daily with breakfast., Disp: 30 tablet, Rfl: 0 .  Multiple Vitamin (MULTIVITAMIN WITH MINERALS) TABS tablet, Take 1 tablet by mouth daily., Disp: , Rfl:  .  terbinafine (LAMISIL) 250 MG tablet, Take 1 tablet (250 mg total) by mouth daily., Disp: 90 tablet, Rfl:  0 .  Vitamin D, Ergocalciferol, (DRISDOL) 50000 units CAPS capsule, Take 1 capsule (50,000 Units total) by mouth every 7 (seven) days., Disp: 4 capsule, Rfl: 0  Allergies  Allergen Reactions  . Contrast Media [Iodinated Diagnostic Agents] Nausea And Vomiting    Pt began vomiting after multihance injection           Objective:  Physical Exam  General: AAO x3, NAD  Dermatological: Nails are hypertrophic, dystrophic, discolored with ill-defined discoloration.  There is no pain in the nails and there is no surrounding redness or drainage.  Mild tinea pedis is present.  No open lesions are identified otherwise and there is no pustules present.  Vascular: Dorsalis Pedis artery and Posterior Tibial artery pedal pulses are 2/4 bilateral with immedate capillary fill time. There is no pain with calf compression, swelling, warmth, erythema.   Neruologic: Grossly intact via light touch bilateral.  Protective threshold with Semmes Wienstein monofilament intact to all pedal sites bilateral.  Musculoskeletal: There is tenderness noted to the fifth metatarsal base of the left foot there is mild pain to vibratory sensation.  There is minimal edema but there is no erythema or increase in warmth.  Minimal discomfort of the peroneal tendon however the peroneal  tendon appears to be intact.  There is no pain with ankle or other areas of the foot.  Muscular strength 5/5 in all groups tested bilateral.     Assessment:   Left fifth metatarsal base fracture vs old injury; onychomycosis with tinea pedis.      Plan:  -Treatment options discussed including all alternatives, risks, and complications -Etiology of symptoms were discussed -X-rays were obtained and reviewed with the patient.  On the left fifth metatarsal base there is a ossicle present which is irregular in nature consistent with a subacute fracture.  This could be result of an old injury but given the fact that she has pinpoint tenderness and pain  to vibratory sensation over this.  This is likely fracture. -Given x-ray findings as well as pain the left foot I have been immobilized in a cam boot which was dispensed today.  Anti-inflammatories as needed.  Ice the area daily. -Regards to nail fungus, tinea pedis discussed various treatment options.  Discussed oral Lamisil will be more beneficial given the thickening the nails.  Discussed medications well success rates and side effects.  Will check a CBC and LFT prior to starting the medication.  I will follow-up with her in 6 weeks for this recheck.  -Follow-up in 2 weeks for recheck of left foot fifth metatarsal fracture  *x-ray left foot next appointment   Trula Slade DPM

## 2017-05-15 NOTE — Telephone Encounter (Signed)
-----   Message from Trula Slade, DPM sent at 05/14/2017  7:36 PM EDT ----- Val- Please let her know that she can start Lamisil. Can you go ahead and order the 90 days for her? Will recheck in 6 weeks. Thanks.

## 2017-05-21 ENCOUNTER — Ambulatory Visit (INDEPENDENT_AMBULATORY_CARE_PROVIDER_SITE_OTHER): Payer: 59 | Admitting: Family Medicine

## 2017-05-21 VITALS — BP 117/78 | HR 85 | Temp 98.5°F | Ht 63.0 in | Wt 259.0 lb

## 2017-05-21 DIAGNOSIS — I10 Essential (primary) hypertension: Secondary | ICD-10-CM

## 2017-05-21 DIAGNOSIS — Z9189 Other specified personal risk factors, not elsewhere classified: Secondary | ICD-10-CM | POA: Diagnosis not present

## 2017-05-21 DIAGNOSIS — R7303 Prediabetes: Secondary | ICD-10-CM

## 2017-05-21 DIAGNOSIS — Z6841 Body Mass Index (BMI) 40.0 and over, adult: Secondary | ICD-10-CM | POA: Diagnosis not present

## 2017-05-21 MED ORDER — METFORMIN HCL 500 MG PO TABS: 500.0000 mg | ORAL_TABLET | Freq: Every day | ORAL | 0 refills | Status: DC

## 2017-05-22 DIAGNOSIS — Z780 Asymptomatic menopausal state: Secondary | ICD-10-CM | POA: Diagnosis not present

## 2017-05-22 NOTE — Progress Notes (Signed)
Office: [phone]  /  Fax: [phone]   HPI:   Chief Complaint: OBESITY [name] is here to discuss her progress with her obesity treatment plan. She is on the keep a food journal with 1300 to 1400 calories and 80 grams of protein daily and is following her eating plan approximately 90 % of the time. She states she is exercising 0 minutes 0 times per week. [name] is really enjoying journaling and is hitting her calorie goal and protein goal consistently. Her weight is 259 lb (117.5 kg) today and has had a weight loss of 6 pounds over a period of 3 weeks since her last visit. She has lost 12 lbs since starting treatment with [place].  Hypertension [name] is a 52 y.o. female with hypertension. There is significant improvement in her blood pressure today. [name] denies chest pain, chest pressure, headache, dizziness or lightheadedness. She is working weight loss to help control her blood pressure with the goal of decreasing her risk of heart attack and stroke. [place]inettes blood pressure is currently controlled.  Pre-Diabetes [name] has a diagnosis of prediabetes based on her elevated Hgb A1c and was informed this puts her at greater risk of developing diabetes. She is taking metformin currently and denies any GI side effects of metformin. [name] continues to work on diet and exercise to decrease risk of diabetes. She denies carb cravings or hypoglycemia.  At risk for diabetes [name] is at higher than average risk for developing diabetes due to her obesity and pre-diabetes. She currently denies polyuria or polydipsia.  ALLERGIES: Allergies  Allergen Reactions  . Contrast Media [Iodinated Diagnostic Agents] Nausea And Vomiting    Pt began vomiting after multihance injection     MEDICATIONS: Current Outpatient Medications on File Prior to Visit  Medication Sig Dispense Refill  . AMLODIPINE BESYLATE PO Take by mouth.    . Ascorbic Acid (VITAMIN C  PO) Take by mouth.    . Azilsartan-Chlorthalidone (EDARBYCLOR PO) Take by mouth.    . Calcium 500-100 MG-UNIT CHEW Chew 1 tablet by mouth 2 (two) times daily.    . meloxicam (MOBIC) 15 MG tablet Take 15 mg by mouth daily as needed for pain.    . Multiple Vitamin (MULTIVITAMIN WITH MINERALS) TABS tablet Take 1 tablet by mouth daily.    Marland Kitchen terbinafine (LAMISIL) 250 MG tablet Take 1 tablet (250 mg total) by mouth daily. 90 tablet 0  . Vitamin D, Ergocalciferol, (DRISDOL) 50000 units CAPS capsule Take 1 capsule (50,000 Units total) by mouth every 7 (seven) days. 4 capsule 0   No current facility-administered medications on file prior to visit.     PAST MEDICAL HISTORY: Past Medical History:  Diagnosis Date  . Anemia   . Constipation   . Gallbladder problem   . History of acute pancreatitis   . Hypertension   . Left shoulder pain   . Low back pain     PAST SURGICAL HISTORY: Past Surgical History:  Procedure Laterality Date  . ABDOMINAL HYSTERECTOMY    . ABDOMINAL HYSTERECTOMY    . CHOLECYSTECTOMY    . PANCREAS SURGERY      SOCIAL HISTORY: Social History   Tobacco Use  . Smoking status: Former Smoker    Last attempt to quit: 01/01/1997    Years since quitting: 20.4  . Smokeless tobacco: Never Used  Substance Use Topics  . Alcohol use: Yes    Comment: occ  . Drug use: Not on file    FAMILY HISTORY:  Family History  Problem Relation Age of Onset  . Hypertension Mother   . Hypertension Father   . Cancer Other     ROS: Review of Systems  Constitutional: Positive for weight loss.  Cardiovascular: Negative for chest pain.       Negative for chest pressure  Gastrointestinal: Negative for diarrhea, nausea and vomiting.  Genitourinary: Negative for frequency.  Neurological: Negative for dizziness and headaches.       Negative for lightheadedness  Endo/Heme/Allergies: Negative for polydipsia.       Negative for carb cravings Negative for hypoglycemia    PHYSICAL  EXAM: Blood pressure 117/78, pulse 85, temperature 98.5 F (36.9 C), temperature source Oral, height 5\' 3"  (1.6 m), weight 259 lb (117.5 kg), SpO2 98 %. Body mass index is 45.88 kg/m. Physical Exam  Constitutional: She is oriented to person, place, and time. She appears well-developed and well-nourished.  Cardiovascular: Normal rate.  Pulmonary/Chest: Effort normal.  Musculoskeletal: Normal range of motion.  Neurological: She is oriented to person, place, and time.  Skin: Skin is warm and dry.  Psychiatric: She has a normal mood and affect. Her behavior is normal.  Vitals reviewed.   RECENT LABS AND TESTS: BMET    Component Value Date/Time   NA 142 01/23/2017 1206   K 4.3 01/23/2017 1206   CL 99 01/23/2017 1206   CO2 29 01/23/2017 1206   GLUCOSE 93 01/23/2017 1206   GLUCOSE 91 08/05/2010 0540   BUN 12 01/23/2017 1206   CREATININE 0.78 01/23/2017 1206   CALCIUM 9.5 01/23/2017 1206   CALCIUM 9.1 08/01/2010 2213   GFRNONAA 88 01/23/2017 1206   GFRAA 102 01/23/2017 1206   Lab Results  Component Value Date   HGBA1C 6.1 (H) 01/23/2017   Lab Results  Component Value Date   INSULIN 21.3 01/23/2017   CBC    Component Value Date/Time   WBC 6.5 05/14/2017 0000   RBC 5.79 (H) 05/14/2017 0000   HGB 13.7 05/14/2017 0000   HGB 13.1 01/23/2017 1206   HCT 41.6 05/14/2017 0000   HCT 39.5 01/23/2017 1206   PLT 146 05/14/2017 0000   MCV 71.8 (L) 05/14/2017 0000   MCV 71 (L) 01/23/2017 1206   MCH 23.7 (L) 05/14/2017 0000   MCHC 32.9 05/14/2017 0000   RDW 15.1 (H) 05/14/2017 0000   RDW 15.8 (H) 01/23/2017 1206   LYMPHSABS 2,581 05/14/2017 0000   LYMPHSABS 2.4 01/23/2017 1206   MONOABS 0.8 08/03/2010 0526   EOSABS 39 05/14/2017 0000   EOSABS 0.1 01/23/2017 1206   BASOSABS 13 05/14/2017 0000   BASOSABS 0.0 01/23/2017 1206   Iron/TIBC/Ferritin/ %Sat No results found for: IRON, TIBC, FERRITIN, IRONPCTSAT Lipid Panel     Component Value Date/Time   CHOL 169 01/23/2017 1206    TRIG 48 01/23/2017 1206   HDL 62 01/23/2017 1206   CHOLHDL 3.1 08/01/2010 2213   VLDL 11 08/01/2010 2213   LDLCALC 97 01/23/2017 1206   Hepatic Function Panel     Component Value Date/Time   PROT 7.4 05/14/2017 0000   PROT 7.2 01/23/2017 1206   ALBUMIN 4.2 01/23/2017 1206   AST 13 05/14/2017 0000   ALT 13 05/14/2017 0000   ALKPHOS 102 01/23/2017 1206   BILITOT 0.6 05/14/2017 0000   BILITOT 0.4 01/23/2017 1206   BILIDIR 0.1 05/14/2017 0000   IBILI 0.5 05/14/2017 0000      Component Value Date/Time   TSH 3.320 01/23/2017 1206   TSH 0.997 08/01/2010 2213  Results for LORIJEAN, HUSSER (MRN [id number]) as of 05/22/2017 07:50  Ref. Range 01/23/2017 12:06  Vitamin D, 25-Hydroxy Latest Ref Range: 30.0 - 100.0 ng/mL 39.0   ASSESSMENT AND PLAN: Essential hypertension  Prediabetes - Plan: metFORMIN (GLUCOPHAGE) 500 MG tablet  At risk for diabetes mellitus  Class 3 severe obesity with serious comorbidity and body mass index (BMI) of 45.0 to 49.9 in adult, unspecified obesity type ([place])  PLAN:  Hypertension We discussed sodium restriction, working on healthy weight loss, and a regular exercise program as the means to achieve improved blood pressure control. Nesreen agreed with this plan and agreed to follow up as directed. We will follow up blood pressure at her next visit and will continue to monitor her blood pressure as well as her progress with the above lifestyle modifications. She will continue her medications as prescribed and will watch for signs of hypotension as she continues her lifestyle modifications.  Pre-Diabetes Tyger will continue to work on weight loss, exercise, and decreasing simple carbohydrates in her diet to help decrease the risk of diabetes. We dicussed metformin including benefits and risks. She was informed that eating too many simple carbohydrates or too many calories at one sitting increases the likelihood of GI side effects. Ariyonna agreed  to continue metformin for now and a prescription was written today for 1 month refill. Dakiyah agreed to follow up with [place] as directed to monitor her progress.  Diabetes risk counseling Sybil was given extended (15 minutes) diabetes prevention counseling today. She is 52 y.o. female and has risk factors for diabetes including obesity and pre-diabetes. We discussed intensive lifestyle modifications today with an emphasis on weight loss as well as increasing exercise and decreasing simple carbohydrates in her diet.  Obesity Morna is currently in the action stage of change. As such, her goal is to continue with weight loss efforts She has agreed to keep a food journal with 1300 to 1400 calories and 80+ grams of protein daily Latacha has been instructed to work up to a goal of 150 minutes of combined cardio and strengthening exercise per week or resistance training 2 times per week for weight loss and overall health benefits. We discussed the following Behavioral Modification Strategies today: planning for success, keep a strict food journal, increasing lean protein intake, increasing vegetables and work on meal planning and easy cooking plans  Patient is to get labs from PCP  Ahjanae has agreed to follow up with our clinic in 2 to 3 weeks. She was informed of the importance of frequent follow up visits to maximize her success with intensive lifestyle modifications for her multiple health conditions.   OBESITY BEHAVIORAL INTERVENTION VISIT  Today's visit was # 9 out of 22.  Starting weight: 271 lbs Starting date: 01/23/17 Today's weight : 259 lbs  Today's date: 05/21/2017 Total lbs lost to date: 12 (Patients must lose 7 lbs in the first 6 months to continue with counseling)   ASK: We discussed the diagnosis of obesity with Becky Bibian today and Hether agreed to give [place] permission to discuss obesity behavioral modification therapy today.  ASSESS: Philomene  has the diagnosis of obesity and her BMI today is 45.89 Winta is in the action stage of change   ADVISE: Saleema was educated on the multiple health risks of obesity as well as the benefit of weight loss to improve her health. She was advised of the need for long term treatment and the importance of lifestyle modifications.  AGREE: Multiple dietary modification  options and treatment options were discussed and  September agreed to the above obesity treatment plan.  I, Doreene Nest, am acting as transcriptionist for Eber Jones, MD  I have reviewed the above documentation for accuracy and completeness, and I agree with the above. - Ilene Qua, MD

## 2017-05-31 ENCOUNTER — Ambulatory Visit (INDEPENDENT_AMBULATORY_CARE_PROVIDER_SITE_OTHER): Payer: 59

## 2017-05-31 ENCOUNTER — Ambulatory Visit (INDEPENDENT_AMBULATORY_CARE_PROVIDER_SITE_OTHER): Payer: 59 | Admitting: Podiatry

## 2017-05-31 ENCOUNTER — Encounter: Payer: Self-pay | Admitting: Podiatry

## 2017-05-31 DIAGNOSIS — B351 Tinea unguium: Secondary | ICD-10-CM | POA: Diagnosis not present

## 2017-05-31 DIAGNOSIS — M84375A Stress fracture, left foot, initial encounter for fracture: Secondary | ICD-10-CM

## 2017-05-31 DIAGNOSIS — M84375D Stress fracture, left foot, subsequent encounter for fracture with routine healing: Secondary | ICD-10-CM | POA: Diagnosis not present

## 2017-06-04 NOTE — Progress Notes (Signed)
Subjective: 52 year old female presents the office today for evaluation of left fifth metatarsal base pain and also nail fungus.  She states the pain is been helping she is been in the boot for the last 2 weeks.  She still getting some pain in the area.  No recent injury or fall since I last saw her.  She is also started on Lamisil ointment not had any side effects.  She has no other concerns today. Denies any systemic complaints such as fevers, chills, nausea, vomiting. No acute changes since last appointment, and no other complaints at this time.   Objective: AAO x3, NAD DP/PT pulses palpable bilaterally, CRT less than 3 seconds There is some mild tenderness palpation of the fifth metatarsal base on the left side and there is localized edema there is no significant erythema or increase in warmth.  Peroneal tendons appear to be intact.  Achilles tendon intact.  No other areas of tenderness identified today.   Overall the nails are unchanged and there is no pain to the nails.  No open lesions or pre-ulcerative lesions.  No pain with calf compression, swelling, warmth, erythema  Assessment: Left fifth metatarsal base avulsion fracture likely subacute; onychomycosis  Plan: -All treatment options discussed with the patient including all alternatives, risks, complications.  -X-rays were obtained and reviewed.  The AP view does appear to be some mild improvement however unchanged on the oblique.  Given her continued pain we will treat as a fracture.  Continue the cam boot for now as well as ice to the area.  Also continue Lamisil and continue to monitor any side effects of the medication.  I will see her back to check both issues. -Patient encouraged to call the office with any questions, concerns, change in symptoms.   Return in about 1 month (around 06/28/2017).  Trula Slade DPM

## 2017-06-07 DIAGNOSIS — M7542 Impingement syndrome of left shoulder: Secondary | ICD-10-CM | POA: Diagnosis not present

## 2017-06-13 ENCOUNTER — Ambulatory Visit (INDEPENDENT_AMBULATORY_CARE_PROVIDER_SITE_OTHER): Payer: 59 | Admitting: Family Medicine

## 2017-06-13 VITALS — BP 138/88 | HR 84 | Temp 98.1°F | Ht 63.0 in | Wt 261.0 lb

## 2017-06-13 DIAGNOSIS — R7303 Prediabetes: Secondary | ICD-10-CM

## 2017-06-13 DIAGNOSIS — Z9189 Other specified personal risk factors, not elsewhere classified: Secondary | ICD-10-CM

## 2017-06-13 DIAGNOSIS — Z6841 Body Mass Index (BMI) 40.0 and over, adult: Secondary | ICD-10-CM | POA: Diagnosis not present

## 2017-06-13 DIAGNOSIS — E559 Vitamin D deficiency, unspecified: Secondary | ICD-10-CM

## 2017-06-13 MED ORDER — METFORMIN HCL 500 MG PO TABS: 500.0000 mg | ORAL_TABLET | Freq: Every day | ORAL | 0 refills | Status: DC

## 2017-06-13 MED ORDER — VITAMIN D (ERGOCALCIFEROL) 1.25 MG (50000 UNIT) PO CAPS: 50000.0000 [IU] | ORAL_CAPSULE | ORAL | 0 refills | Status: DC

## 2017-06-13 NOTE — Progress Notes (Signed)
Office: [phone]  /  Fax: [phone]   HPI:   Chief Complaint: OBESITY [name] is here to discuss her progress with her obesity treatment plan. She is on the keep a food journal with 1300-1400 calories and 80+ grams of protein daily and is following her eating plan approximately 90 % of the time. She states she is walking and doing arm exercises with bands for 10 minutes 2 times per week. [name] has gone out to eat a few times. Doing journaling most of the time.  Her weight is 261 lb (118.4 kg) today and has gained 2 pounds since her last visit. She has lost 10 lbs since starting treatment with [place].  Vitamin D Deficiency [name] has a diagnosis of vitamin D deficiency. She is currently taking prescription Vit D. She notes improving fatigue and denies nausea, vomiting or muscle weakness.  Pre-Diabetes [name] has a diagnosis of pre-diabetes based on her elevated Hgb A1c and was informed this puts her at greater risk of developing diabetes. She notes carbohydrate cravings (increased bread, went to eat at [place] [employer] and [employer]). She is taking metformin currently and continues to work on diet and exercise to decrease risk of diabetes. She denies nausea or hypoglycemia.  At risk for diabetes [name] is at higher than average risk for developing diabetes due to her obesity and pre-diabetes. She currently denies polyuria or polydipsia.  ALLERGIES: Allergies  Allergen Reactions  . Contrast Media [Iodinated Diagnostic Agents] Nausea And Vomiting    Pt began vomiting after multihance injection     MEDICATIONS: Current Outpatient Medications on File Prior to Visit  Medication Sig Dispense Refill  . AMLODIPINE BESYLATE PO Take by mouth.    . Ascorbic Acid (VITAMIN C PO) Take by mouth.    . Azilsartan-Chlorthalidone (EDARBYCLOR PO) Take by mouth.    . Calcium 500-100 MG-UNIT CHEW Chew 1 tablet by mouth 2 (two) times daily.    . meloxicam (MOBIC) 15 MG  tablet Take 15 mg by mouth daily as needed for pain.    . Multiple Vitamin (MULTIVITAMIN WITH MINERALS) TABS tablet Take 1 tablet by mouth daily.    Marland Kitchen terbinafine (LAMISIL) 250 MG tablet Take 1 tablet (250 mg total) by mouth daily. 90 tablet 0   No current facility-administered medications on file prior to visit.     PAST MEDICAL HISTORY: Past Medical History:  Diagnosis Date  . Anemia   . Constipation   . Gallbladder problem   . History of acute pancreatitis   . Hypertension   . Left shoulder pain   . Low back pain     PAST SURGICAL HISTORY: Past Surgical History:  Procedure Laterality Date  . ABDOMINAL HYSTERECTOMY    . ABDOMINAL HYSTERECTOMY    . CHOLECYSTECTOMY    . PANCREAS SURGERY      SOCIAL HISTORY: Social History   Tobacco Use  . Smoking status: Former Smoker    Last attempt to quit: 01/01/1997    Years since quitting: 20.4  . Smokeless tobacco: Never Used  Substance Use Topics  . Alcohol use: Yes    Comment: occ  . Drug use: Not on file    FAMILY HISTORY: Family History  Problem Relation Age of Onset  . Hypertension Mother   . Hypertension Father   . Cancer Other     ROS: Review of Systems  Constitutional: Positive for malaise/fatigue. Negative for weight loss.  Gastrointestinal: Negative for nausea and vomiting.  Genitourinary: Negative for frequency.  Musculoskeletal:  Negative muscle weakness  Endo/Heme/Allergies: Negative for polydipsia.       Negative hypoglycemia    PHYSICAL EXAM: Blood pressure 138/88, pulse 84, temperature 98.1 F (36.7 C), temperature source Oral, height 5\' 3"  (1.6 m), weight 261 lb (118.4 kg), SpO2 97 %. Body mass index is 46.23 kg/m. Physical Exam  Constitutional: She is oriented to person, place, and time. She appears well-developed and well-nourished.  Cardiovascular: Normal rate.  Pulmonary/Chest: Effort normal.  Musculoskeletal: Normal range of motion.  Neurological: She is oriented to person, place,  and time.  Skin: Skin is warm and dry.  Psychiatric: She has a normal mood and affect. Her behavior is normal.  Vitals reviewed.   RECENT LABS AND TESTS: BMET    Component Value Date/Time   NA 142 01/23/2017 1206   K 4.3 01/23/2017 1206   CL 99 01/23/2017 1206   CO2 29 01/23/2017 1206   GLUCOSE 93 01/23/2017 1206   GLUCOSE 91 08/05/2010 0540   BUN 12 01/23/2017 1206   CREATININE 0.78 01/23/2017 1206   CALCIUM 9.5 01/23/2017 1206   CALCIUM 9.1 08/01/2010 2213   GFRNONAA 88 01/23/2017 1206   GFRAA 102 01/23/2017 1206   Lab Results  Component Value Date   HGBA1C 6.1 (H) 01/23/2017   Lab Results  Component Value Date   INSULIN 21.3 01/23/2017   CBC    Component Value Date/Time   WBC 6.5 05/14/2017 0000   RBC 5.79 (H) 05/14/2017 0000   HGB 13.7 05/14/2017 0000   HGB 13.1 01/23/2017 1206   HCT 41.6 05/14/2017 0000   HCT 39.5 01/23/2017 1206   PLT 146 05/14/2017 0000   MCV 71.8 (Michelle) 05/14/2017 0000   MCV 71 (Michelle) 01/23/2017 1206   MCH 23.7 (Michelle) 05/14/2017 0000   MCHC 32.9 05/14/2017 0000   RDW 15.1 (H) 05/14/2017 0000   RDW 15.8 (H) 01/23/2017 1206   LYMPHSABS 2,581 05/14/2017 0000   LYMPHSABS 2.4 01/23/2017 1206   MONOABS 0.8 08/03/2010 0526   EOSABS 39 05/14/2017 0000   EOSABS 0.1 01/23/2017 1206   BASOSABS 13 05/14/2017 0000   BASOSABS 0.0 01/23/2017 1206   Iron/TIBC/Ferritin/ %Sat No results found for: IRON, TIBC, FERRITIN, IRONPCTSAT Lipid Panel     Component Value Date/Time   CHOL 169 01/23/2017 1206   TRIG 48 01/23/2017 1206   HDL 62 01/23/2017 1206   CHOLHDL 3.1 08/01/2010 2213   VLDL 11 08/01/2010 2213   LDLCALC 97 01/23/2017 1206   Hepatic Function Panel     Component Value Date/Time   PROT 7.4 05/14/2017 0000   PROT 7.2 01/23/2017 1206   ALBUMIN 4.2 01/23/2017 1206   AST 13 05/14/2017 0000   ALT 13 05/14/2017 0000   ALKPHOS 102 01/23/2017 1206   BILITOT 0.6 05/14/2017 0000   BILITOT 0.4 01/23/2017 1206   BILIDIR 0.1 05/14/2017 0000    IBILI 0.5 05/14/2017 0000      Component Value Date/Time   TSH 3.320 01/23/2017 1206   TSH 0.997 08/01/2010 2213  Results for Bartley, Breelyn (MRN [id number]) as of 06/13/2017 11:13  Ref. Range 01/23/2017 12:06  Vitamin D, 25-Hydroxy Latest Ref Range: 30.0 - 100.0 ng/mL 39.0    ASSESSMENT AND PLAN: Vitamin D deficiency - Plan: VITAMIN D 25 Hydroxy (Vit-D Deficiency, Fractures), Vitamin D, Ergocalciferol, (DRISDOL) 50000 units CAPS capsule  Prediabetes - Plan: Comprehensive metabolic panel, Hemoglobin A1c, Insulin, random, metFORMIN (GLUCOPHAGE) 500 MG tablet  At risk for diabetes mellitus  Class 3 severe obesity with serious comorbidity and  body mass index (BMI) of 45.0 to 49.9 in adult, unspecified obesity type ([place])  PLAN:  Vitamin D Deficiency [name] was informed that low vitamin D levels contributes to fatigue and are associated with obesity, breast, and colon cancer. [name] agrees to continue taking prescription Vit D @50 ,000 IU every week #4 and we will refill for 1 month. She will follow up for routine testing of vitamin D, at least 2-3 times per year. She was informed of the risk of over-replacement of vitamin D and agrees to not increase her dose unless she discusses this with [place] first. [name] agrees to follow up with our clinic in 2 weeks.  Pre-Diabetes [name] will continue to work on weight loss, exercise, and decreasing simple carbohydrates in her diet to help decrease the risk of diabetes. We dicussed metformin including benefits and risks. She was informed that eating too many simple carbohydrates or too many calories at one sitting increases the likelihood of GI side effects. [name] agrees to continue taking metformin 500 mg PO q AM #30 and we will refill for 1 month. [name] agrees to follow up with our clinic in 2 weeks as directed to monitor her progress.  Diabetes risk counselling [name] was given extended (15 minutes) diabetes  prevention counseling today. She is 52 y.o. female and has risk factors for diabetes including obesity and pre-diabetes. We discussed intensive lifestyle modifications today with an emphasis on weight loss as well as increasing exercise and decreasing simple carbohydrates in her diet.  Obesity [name] is currently in the action stage of change. As such, her goal is to continue with weight loss efforts She has agreed to keep a food journal with 1300-1400 calories and 80 grams of protein daily [name] has been instructed to work up to a goal of 150 minutes of combined cardio and strengthening exercise per week for weight loss and overall health benefits. We discussed the following Behavioral Modification Strategies today: increasing lean protein intake, increasing vegetables, work on meal planning and easy cooking plans, better snacking choices, and planning for success   [name] has agreed to follow up with our clinic in 2 weeks. She was informed of the importance of frequent follow up visits to maximize her success with intensive lifestyle modifications for her multiple health conditions.   OBESITY BEHAVIORAL INTERVENTION VISIT  Today's visit was # 10 out of 22.  Starting weight: 271 lbs Starting date: 01/23/17 Today's weight : 261 lbs Today's date: 06/13/2017 Total lbs lost to date: 10 (Patients must lose 7 lbs in the first 6 months to continue with counseling)   ASK: We discussed the diagnosis of obesity with Zonnique Schumpert today and Clodagh agreed to give [place] permission to discuss obesity behavioral modification therapy today.  ASSESS: Mychaela has the diagnosis of obesity and her BMI today is 46.25 Joycelin is in the action stage of change   ADVISE: Azizi was educated on the multiple health risks of obesity as well as the benefit of weight loss to improve her health. She was advised of the need for long term treatment and the importance of lifestyle  modifications.  AGREE: Multiple dietary modification options and treatment options were discussed and  Jesse agreed to the above obesity treatment plan.  I, Trixie Dredge, am acting as transcriptionist for Ilene Qua, MD  I have reviewed the above documentation for accuracy and completeness, and I agree with the above. - Ilene Qua, MD

## 2017-06-14 LAB — HEMOGLOBIN A1C
ESTIMATED AVERAGE GLUCOSE: 123 mg/dL
Hgb A1c MFr Bld: 5.9 % — ABNORMAL HIGH (ref 4.8–5.6)

## 2017-06-14 LAB — COMPREHENSIVE METABOLIC PANEL
ALT: 14 IU/L (ref 0–32)
AST: 13 IU/L (ref 0–40)
Albumin/Globulin Ratio: 1.3 (ref 1.2–2.2)
Albumin: 4.2 g/dL (ref 3.5–5.5)
Alkaline Phosphatase: 102 IU/L (ref 39–117)
BUN/Creatinine Ratio: 15 (ref 9–23)
BUN: 13 mg/dL (ref 6–24)
Bilirubin Total: 0.5 mg/dL (ref 0.0–1.2)
CALCIUM: 9.8 mg/dL (ref 8.7–10.2)
CO2: 26 mmol/L (ref 20–29)
CREATININE: 0.85 mg/dL (ref 0.57–1.00)
Chloride: 98 mmol/L (ref 96–106)
GFR calc Af Amer: 92 mL/min/{1.73_m2} (ref >59)
GFR, EST NON AFRICAN AMERICAN: 80 mL/min/{1.73_m2} (ref >59)
Globulin, Total: 3.2 g/dL (ref 1.5–4.5)
Glucose: 85 mg/dL (ref 65–99)
Potassium: 4 mmol/L (ref 3.5–5.2)
Sodium: 141 mmol/L (ref 134–144)
Total Protein: 7.4 g/dL (ref 6.0–8.5)

## 2017-06-14 LAB — VITAMIN D 25 HYDROXY (VIT D DEFICIENCY, FRACTURES): Vit D, 25-Hydroxy: 66.2 ng/mL (ref 30.0–100.0)

## 2017-06-14 LAB — INSULIN, RANDOM: INSULIN: 21.8 u[IU]/mL (ref 2.6–24.9)

## 2017-06-27 ENCOUNTER — Ambulatory Visit (INDEPENDENT_AMBULATORY_CARE_PROVIDER_SITE_OTHER): Payer: 59 | Admitting: Family Medicine

## 2017-06-27 VITALS — BP 135/82 | HR 95 | Temp 99.0°F | Ht 63.0 in | Wt 262.0 lb

## 2017-06-27 DIAGNOSIS — Z6841 Body Mass Index (BMI) 40.0 and over, adult: Secondary | ICD-10-CM

## 2017-06-27 DIAGNOSIS — E559 Vitamin D deficiency, unspecified: Secondary | ICD-10-CM

## 2017-06-27 DIAGNOSIS — Z9189 Other specified personal risk factors, not elsewhere classified: Secondary | ICD-10-CM | POA: Diagnosis not present

## 2017-06-27 DIAGNOSIS — R7303 Prediabetes: Secondary | ICD-10-CM | POA: Diagnosis not present

## 2017-06-27 MED ORDER — LIRAGLUTIDE 18 MG/3ML ~~LOC~~ SOPN: 0.6000 mg | PEN_INJECTOR | SUBCUTANEOUS | 0 refills | Status: DC

## 2017-06-27 MED ORDER — INSULIN PEN NEEDLE 32G X 4 MM MISC: 1.0000 | Freq: Two times a day (BID) | 0 refills | Status: DC

## 2017-06-27 NOTE — Progress Notes (Signed)
Office: [phone]  /  Fax: [phone]   HPI:   Chief Complaint: OBESITY [name] is here to discuss her progress with her obesity treatment plan. She is on the journaling plan with 1300 to 1400 calories and 80+ grams of protein daily and is following her eating plan approximately 90 % of the time. She states she is walking 20 minutes 2 times per week. [name] had a few days of indulgence earlier this week and still hit her protein goal. She plans to continue journaling. Her weight is 262 lb (118.8 kg) today and has had a weight gain of 1 pound over a period of 2 weeks since her last visit. She has lost 9 lbs since starting treatment with [place].  Vitamin D deficiency [name] has a diagnosis of vitamin D deficiency. She is currently taking vit D. Fatigue is improving and she denies nausea, vomiting or muscle weakness.  Pre-Diabetes [name] has a diagnosis of prediabetes based on her elevated Hgb A1c and was informed this puts her at greater risk of developing diabetes. She has minimal improvement in Hgb A1c and no improvement in insulin. She is taking metformin currently and continues to work on diet and exercise to decrease risk of diabetes. She denies nausea or hypoglycemia.  At risk for diabetes [name] is at higher than average risk for developing diabetes due to her obesity and pre-diabetes. She currently denies polyuria or polydipsia.  ALLERGIES: Allergies  Allergen Reactions  . Contrast Media [Iodinated Diagnostic Agents] Nausea And Vomiting    Pt began vomiting after multihance injection     MEDICATIONS: Current Outpatient Medications on File Prior to Visit  Medication Sig Dispense Refill  . AMLODIPINE BESYLATE PO Take by mouth.    . Ascorbic Acid (VITAMIN C PO) Take by mouth.    . Azilsartan-Chlorthalidone (EDARBYCLOR PO) Take by mouth.    . Calcium 500-100 MG-UNIT CHEW Chew 1 tablet by mouth 2 (two) times daily.    . meloxicam (MOBIC) 15 MG tablet  Take 15 mg by mouth daily as needed for pain.    . Multiple Vitamin (MULTIVITAMIN WITH MINERALS) TABS tablet Take 1 tablet by mouth daily.    Marland Kitchen terbinafine (LAMISIL) 250 MG tablet Take 1 tablet (250 mg total) by mouth daily. 90 tablet 0  . Vitamin D, Ergocalciferol, (DRISDOL) 50000 units CAPS capsule Take 1 capsule (50,000 Units total) by mouth every 7 (seven) days. 4 capsule 0   No current facility-administered medications on file prior to visit.     PAST MEDICAL HISTORY: Past Medical History:  Diagnosis Date  . Anemia   . Constipation   . Gallbladder problem   . History of acute pancreatitis   . Hypertension   . Left shoulder pain   . Low back pain     PAST SURGICAL HISTORY: Past Surgical History:  Procedure Laterality Date  . ABDOMINAL HYSTERECTOMY    . ABDOMINAL HYSTERECTOMY    . CHOLECYSTECTOMY    . PANCREAS SURGERY      SOCIAL HISTORY: Social History   Tobacco Use  . Smoking status: Former Smoker    Last attempt to quit: 01/01/1997    Years since quitting: 20.4  . Smokeless tobacco: Never Used  Substance Use Topics  . Alcohol use: Yes    Comment: occ  . Drug use: Not on file    FAMILY HISTORY: Family History  Problem Relation Age of Onset  . Hypertension Mother   . Hypertension Father   . Cancer Other  ROS: Review of Systems  Constitutional: Positive for malaise/fatigue. Negative for weight loss.  Gastrointestinal: Negative for nausea and vomiting.  Genitourinary: Negative for frequency.  Musculoskeletal:       Negative for muscle weakness  Endo/Heme/Allergies: Negative for polydipsia.       Negative for hypoglycemia    PHYSICAL EXAM: Blood pressure 135/82, pulse 95, temperature 99 F (37.2 C), temperature source Oral, height 5\' 3"  (1.6 m), weight 262 lb (118.8 kg), SpO2 98 %. Body mass index is 46.41 kg/m. Physical Exam  Constitutional: She is oriented to person, place, and time. She appears well-developed and well-nourished.    Cardiovascular: Normal rate.  Pulmonary/Chest: Effort normal.  Musculoskeletal: Normal range of motion.  Neurological: She is oriented to person, place, and time.  Skin: Skin is warm and dry.  Psychiatric: She has a normal mood and affect. Her behavior is normal.  Vitals reviewed.   RECENT LABS AND TESTS: BMET    Component Value Date/Time   NA 141 06/13/2017 1001   K 4.0 06/13/2017 1001   CL 98 06/13/2017 1001   CO2 26 06/13/2017 1001   GLUCOSE 85 06/13/2017 1001   GLUCOSE 91 08/05/2010 0540   BUN 13 06/13/2017 1001   CREATININE 0.85 06/13/2017 1001   CALCIUM 9.8 06/13/2017 1001   CALCIUM 9.1 08/01/2010 2213   GFRNONAA 80 06/13/2017 1001   GFRAA 92 06/13/2017 1001   Lab Results  Component Value Date   HGBA1C 5.9 (H) 06/13/2017   HGBA1C 6.1 (H) 01/23/2017   Lab Results  Component Value Date   INSULIN 21.8 06/13/2017   INSULIN 21.3 01/23/2017   CBC    Component Value Date/Time   WBC 6.5 05/14/2017 0000   RBC 5.79 (H) 05/14/2017 0000   HGB 13.7 05/14/2017 0000   HGB 13.1 01/23/2017 1206   HCT 41.6 05/14/2017 0000   HCT 39.5 01/23/2017 1206   PLT 146 05/14/2017 0000   MCV 71.8 (L) 05/14/2017 0000   MCV 71 (L) 01/23/2017 1206   MCH 23.7 (L) 05/14/2017 0000   MCHC 32.9 05/14/2017 0000   RDW 15.1 (H) 05/14/2017 0000   RDW 15.8 (H) 01/23/2017 1206   LYMPHSABS 2,581 05/14/2017 0000   LYMPHSABS 2.4 01/23/2017 1206   MONOABS 0.8 08/03/2010 0526   EOSABS 39 05/14/2017 0000   EOSABS 0.1 01/23/2017 1206   BASOSABS 13 05/14/2017 0000   BASOSABS 0.0 01/23/2017 1206   Iron/TIBC/Ferritin/ %Sat No results found for: IRON, TIBC, FERRITIN, IRONPCTSAT Lipid Panel     Component Value Date/Time   CHOL 169 01/23/2017 1206   TRIG 48 01/23/2017 1206   HDL 62 01/23/2017 1206   CHOLHDL 3.1 08/01/2010 2213   VLDL 11 08/01/2010 2213   LDLCALC 97 01/23/2017 1206   Hepatic Function Panel     Component Value Date/Time   PROT 7.4 06/13/2017 1001   ALBUMIN 4.2 06/13/2017  1001   AST 13 06/13/2017 1001   ALT 14 06/13/2017 1001   ALKPHOS 102 06/13/2017 1001   BILITOT 0.5 06/13/2017 1001   BILIDIR 0.1 05/14/2017 0000   IBILI 0.5 05/14/2017 0000      Component Value Date/Time   TSH 3.320 01/23/2017 1206   TSH 0.997 08/01/2010 2213   Results for RESHMA, HOEY (MRN [id number]) as of 06/27/2017 13:31  Ref. Range 06/13/2017 10:01  Vitamin D, 25-Hydroxy Latest Ref Range: 30.0 - 100.0 ng/mL 66.2   ASSESSMENT AND PLAN: Prediabetes - Plan: liraglutide (VICTOZA) 18 MG/3ML SOPN  Vitamin D deficiency  At risk for  diabetes mellitus  Class 3 severe obesity with serious comorbidity and body mass index (BMI) of 45.0 to 49.9 in adult, unspecified obesity type ([place])  PLAN:  Vitamin D Deficiency Chalonda was informed that low vitamin D levels contributes to fatigue and are associated with obesity, breast, and colon cancer. She agrees to stop prescription Vit D when her prescription runs out and start OTC Vit D. Maysen will follow up for routine testing of vitamin D, at least 2-3 times per year. She was informed of the risk of over-replacement of vitamin D and agrees to not increase her dose unless she discusses this with [place] first. Corean agrees to follow up as directed.  Pre-Diabetes Janaysia will continue to work on weight loss, exercise, and decreasing simple carbohydrates in her diet to help decrease the risk of diabetes. We dicussed metformin including benefits and risks. She was informed that eating too many simple carbohydrates or too many calories at one sitting increases the likelihood of GI side effects. Shoshannah agrees to stop  metformin and start Victoza 0.6 mg daily #2 pens with no refills and follow up with [place] as directed to monitor her progress.  Diabetes risk counseling Priyal was given extended (15 minutes) diabetes prevention counseling today. She is 52 y.o. female and has risk factors for diabetes including obesity and  pre-diabetes. We discussed intensive lifestyle modifications today with an emphasis on weight loss as well as increasing exercise and decreasing simple carbohydrates in her diet.  Obesity Noreene is currently in the action stage of change. As such, her goal is to continue with weight loss efforts She has agreed to keep a food journal with 1300 to 1400 calories and 80+ grams of protein daily Daurice has been instructed to work up to a goal of 150 minutes of combined cardio and strengthening exercise per week for weight loss and overall health benefits. We discussed the following Behavioral Modification Strategies today: planning for success, increasing lean protein intake, increasing vegetables and work on meal planning and easy cooking plans  Jacquelin has agreed to follow up with our clinic in 2 weeks. She was informed of the importance of frequent follow up visits to maximize her success with intensive lifestyle modifications for her multiple health conditions.   OBESITY BEHAVIORAL INTERVENTION VISIT  Today's visit was # 11 out of 22.  Starting weight: 271 lbs Starting date: 01/23/17 Today's weight : 262 lbs Today's date: 06/27/2017 Total lbs lost to date: 9 (Patients must lose 7 lbs in the first 6 months to continue with counseling)   ASK: We discussed the diagnosis of obesity with Marji Blaydes today and Neylan agreed to give [place] permission to discuss obesity behavioral modification therapy today.  ASSESS: Debrah has the diagnosis of obesity and her BMI today is 46.42 Ronna is in the action stage of change   ADVISE: Kennethia was educated on the multiple health risks of obesity as well as the benefit of weight loss to improve her health. She was advised of the need for long term treatment and the importance of lifestyle modifications.  AGREE: Multiple dietary modification options and treatment options were discussed and  Octivia agreed to the  above obesity treatment plan.  I, Doreene Nest, am acting as transcriptionist for Eber Jones, MD  I have reviewed the above documentation for accuracy and completeness, and I agree with the above. - Ilene Qua, MD

## 2017-06-28 ENCOUNTER — Ambulatory Visit (INDEPENDENT_AMBULATORY_CARE_PROVIDER_SITE_OTHER): Payer: 59

## 2017-06-28 ENCOUNTER — Ambulatory Visit: Payer: 59 | Admitting: Podiatry

## 2017-06-28 DIAGNOSIS — M84375D Stress fracture, left foot, subsequent encounter for fracture with routine healing: Secondary | ICD-10-CM | POA: Diagnosis not present

## 2017-06-28 DIAGNOSIS — Z79899 Other long term (current) drug therapy: Secondary | ICD-10-CM | POA: Diagnosis not present

## 2017-06-28 DIAGNOSIS — B351 Tinea unguium: Secondary | ICD-10-CM | POA: Diagnosis not present

## 2017-06-29 NOTE — Progress Notes (Signed)
Subjective: 52 year old female presents the office today for evaluation of left fifth metatarsal base pain and also nail fungus.  She states that she can tell a difference after using 1 month of Lamisil.  She does not have any side effects to taking the medication.  She also states that her left foot is feeling better in the boot.  She still gets some occasional discomfort but overall she is doing much better. She denies any recent injury since I last saw and she denies any increase in swelling.  She has no new concerns today.  Denies any systemic complaints such as fevers, chills, nausea, vomiting. No acute changes since last appointment, and no other complaints at this time.   Objective: AAO x3, NAD DP/PT pulses palpable bilaterally, CRT less than 3 seconds There is some mild tenderness palpation gently on the fifth metatarsal base but there is no pain on the course of the peroneal tendon.  There is trace edema there is no significant erythema or increased warmth.  There is no other areas of tenderness identified at this time.  Overall the nails do appear to be somewhat improved and there is clearing on the proximal nail borders.  There is no pain in the nails there is no surrounding redness or drainage.  No open lesions or pre-ulcerative lesions. No pain with calf compression, swelling, warmth, erythema  Assessment: Left fifth metatarsal base avulsion fracture likely subacute; onychomycosis  Plan: -All treatment options discussed with the patient including all alternatives, risks, complications.  -X-rays were obtained and reviewed.  Likely subacute avulsion fracture present to the fifth metatarsal base which appears to be no significant change compared to prior x-ray.  There is no evidence of acute fracture identified otherwise.  Clinically she is doing better.  We will continue the cam boot as this has been helping.  As she starts to feel better she can start to transition to regular shoe as  tolerated. -Continue Lamisil.  We will recheck a CBC and LFT.  Return in about 3 weeks (around 07/19/2017).  Trula Slade DPM

## 2017-07-08 DIAGNOSIS — M7542 Impingement syndrome of left shoulder: Secondary | ICD-10-CM | POA: Diagnosis not present

## 2017-07-11 DIAGNOSIS — M25512 Pain in left shoulder: Secondary | ICD-10-CM | POA: Diagnosis not present

## 2017-07-15 DIAGNOSIS — M7542 Impingement syndrome of left shoulder: Secondary | ICD-10-CM | POA: Diagnosis not present

## 2017-07-17 ENCOUNTER — Ambulatory Visit (INDEPENDENT_AMBULATORY_CARE_PROVIDER_SITE_OTHER): Payer: 59 | Admitting: Family Medicine

## 2017-07-22 ENCOUNTER — Ambulatory Visit (INDEPENDENT_AMBULATORY_CARE_PROVIDER_SITE_OTHER): Payer: 59 | Admitting: Family Medicine

## 2017-07-22 VITALS — BP 141/82 | HR 70 | Temp 98.5°F | Ht 63.0 in | Wt 264.0 lb

## 2017-07-22 DIAGNOSIS — E782 Mixed hyperlipidemia: Secondary | ICD-10-CM | POA: Diagnosis not present

## 2017-07-22 DIAGNOSIS — Z6841 Body Mass Index (BMI) 40.0 and over, adult: Secondary | ICD-10-CM

## 2017-07-22 DIAGNOSIS — I10 Essential (primary) hypertension: Secondary | ICD-10-CM | POA: Diagnosis not present

## 2017-07-22 DIAGNOSIS — R7303 Prediabetes: Secondary | ICD-10-CM | POA: Diagnosis not present

## 2017-07-22 DIAGNOSIS — R5383 Other fatigue: Secondary | ICD-10-CM | POA: Diagnosis not present

## 2017-07-22 DIAGNOSIS — D649 Anemia, unspecified: Secondary | ICD-10-CM | POA: Diagnosis not present

## 2017-07-23 ENCOUNTER — Ambulatory Visit: Payer: 59 | Admitting: Podiatry

## 2017-07-23 NOTE — Progress Notes (Signed)
Office: [phone]  /  Fax: [phone]   HPI:   Chief Complaint: OBESITY [name] is here to discuss her progress with her obesity treatment plan. She is on the keep a food journal with 1300 to 1400 calories and 80+ grams of protein daily and is following her eating plan approximately 90 % of the time. She states she is exercising 0 minutes 0 times per week. [name] just returned from [place] caring for her dad and is getting frustrated with the lack of weight loss and hitting goals of calories and protein. Her weight is 264 lb (119.7 kg) today and has had a weight gain of 2 pounds over a period of 3 weeks since her last visit. She has lost 7 lbs since starting treatment with [place].  Pre-Diabetes [name] has a diagnosis of prediabetes based on her elevated Hgb A1c and was informed this puts her at greater risk of developing diabetes. She is on Victoza and continues to work on diet and exercise to decrease risk of diabetes. She denies nausea, vomiting or GI upset. She denies hypoglycemia.  Hypertension [name] is a 52 y.o. female with hypertension. Her blood pressure is elevated today. [name] denies chest pain, chest pressure or headache. She is working weight loss to help control her blood pressure with the goal of decreasing her risk of heart attack and stroke. [name] blood pressure has been labile.  Fatigue [name] has a diagnosis of fatigue and has had no significant improvement with following journaling.  ALLERGIES: Allergies  Allergen Reactions  . Contrast Media [Iodinated Diagnostic Agents] Nausea And Vomiting    Pt began vomiting after multihance injection     MEDICATIONS: Current Outpatient Medications on File Prior to Visit  Medication Sig Dispense Refill  . AMLODIPINE BESYLATE PO Take by mouth.    . Ascorbic Acid (VITAMIN C PO) Take by mouth.    . Azilsartan-Chlorthalidone (EDARBYCLOR PO) Take by mouth.    . Calcium 500-100  MG-UNIT CHEW Chew 1 tablet by mouth 2 (two) times daily.    . Insulin Pen Needle (BD PEN NEEDLE NANO 2ND GEN) 32G X 4 MM MISC 1 Package by Does not apply route 2 (two) times daily. 100 each 0  . liraglutide (VICTOZA) 18 MG/3ML SOPN Inject 0.1 mLs (0.6 mg total) into the skin every morning. 2 pen 0  . meloxicam (MOBIC) 15 MG tablet Take 15 mg by mouth daily as needed for pain.    . Multiple Vitamin (MULTIVITAMIN WITH MINERALS) TABS tablet Take 1 tablet by mouth daily.    Marland Kitchen terbinafine (LAMISIL) 250 MG tablet Take 1 tablet (250 mg total) by mouth daily. 90 tablet 0  . Vitamin D, Ergocalciferol, (DRISDOL) 50000 units CAPS capsule Take 1 capsule (50,000 Units total) by mouth every 7 (seven) days. 4 capsule 0   No current facility-administered medications on file prior to visit.     PAST MEDICAL HISTORY: Past Medical History:  Diagnosis Date  . Anemia   . Constipation   . Gallbladder problem   . History of acute pancreatitis   . Hypertension   . Left shoulder pain   . Low back pain     PAST SURGICAL HISTORY: Past Surgical History:  Procedure Laterality Date  . ABDOMINAL HYSTERECTOMY    . ABDOMINAL HYSTERECTOMY    . CHOLECYSTECTOMY    . PANCREAS SURGERY      SOCIAL HISTORY: Social History   Tobacco Use  . Smoking status: Former Smoker    Last  attempt to quit: 01/01/1997    Years since quitting: 20.5  . Smokeless tobacco: Never Used  Substance Use Topics  . Alcohol use: Yes    Comment: occ  . Drug use: Not on file    FAMILY HISTORY: Family History  Problem Relation Age of Onset  . Hypertension Mother   . Hypertension Father   . Cancer Other     ROS: Review of Systems  Constitutional: Positive for malaise/fatigue. Negative for weight loss.  Cardiovascular: Negative for chest pain.       Negative for chest pressure  Gastrointestinal: Negative for diarrhea, nausea and vomiting.  Neurological: Negative for headaches.  Endo/Heme/Allergies:       Negative for  hypoglycemia    PHYSICAL EXAM: Blood pressure (!) 141/82, pulse 70, temperature 98.5 F (36.9 C), temperature source Oral, height 5\' 3"  (1.6 m), weight 264 lb (119.7 kg), SpO2 98 %. Body mass index is 46.77 kg/m. Physical Exam  Constitutional: She is oriented to person, place, and time. She appears well-developed and well-nourished.  Cardiovascular: Normal rate.  Pulmonary/Chest: Effort normal.  Musculoskeletal: Normal range of motion.  Neurological: She is oriented to person, place, and time.  Skin: Skin is warm and dry.  Psychiatric: She has a normal mood and affect. Her behavior is normal.  Vitals reviewed.   RECENT LABS AND TESTS: BMET    Component Value Date/Time   NA 141 06/13/2017 1001   K 4.0 06/13/2017 1001   CL 98 06/13/2017 1001   CO2 26 06/13/2017 1001   GLUCOSE 85 06/13/2017 1001   GLUCOSE 91 08/05/2010 0540   BUN 13 06/13/2017 1001   CREATININE 0.85 06/13/2017 1001   CALCIUM 9.8 06/13/2017 1001   CALCIUM 9.1 08/01/2010 2213   GFRNONAA 80 06/13/2017 1001   GFRAA 92 06/13/2017 1001   Lab Results  Component Value Date   HGBA1C 5.9 (H) 06/13/2017   HGBA1C 6.1 (H) 01/23/2017   Lab Results  Component Value Date   INSULIN 21.8 06/13/2017   INSULIN 21.3 01/23/2017   CBC    Component Value Date/Time   WBC 6.5 05/14/2017 0000   RBC 5.79 (H) 05/14/2017 0000   HGB 13.7 05/14/2017 0000   HGB 13.1 01/23/2017 1206   HCT 41.6 05/14/2017 0000   HCT 39.5 01/23/2017 1206   PLT 146 05/14/2017 0000   MCV 71.8 (L) 05/14/2017 0000   MCV 71 (L) 01/23/2017 1206   MCH 23.7 (L) 05/14/2017 0000   MCHC 32.9 05/14/2017 0000   RDW 15.1 (H) 05/14/2017 0000   RDW 15.8 (H) 01/23/2017 1206   LYMPHSABS 2,581 05/14/2017 0000   LYMPHSABS 2.4 01/23/2017 1206   MONOABS 0.8 08/03/2010 0526   EOSABS 39 05/14/2017 0000   EOSABS 0.1 01/23/2017 1206   BASOSABS 13 05/14/2017 0000   BASOSABS 0.0 01/23/2017 1206   Iron/TIBC/Ferritin/ %Sat No results found for: IRON, TIBC,  FERRITIN, IRONPCTSAT Lipid Panel     Component Value Date/Time   CHOL 169 01/23/2017 1206   TRIG 48 01/23/2017 1206   HDL 62 01/23/2017 1206   CHOLHDL 3.1 08/01/2010 2213   VLDL 11 08/01/2010 2213   LDLCALC 97 01/23/2017 1206   Hepatic Function Panel     Component Value Date/Time   PROT 7.4 06/13/2017 1001   ALBUMIN 4.2 06/13/2017 1001   AST 13 06/13/2017 1001   ALT 14 06/13/2017 1001   ALKPHOS 102 06/13/2017 1001   BILITOT 0.5 06/13/2017 1001   BILIDIR 0.1 05/14/2017 0000   IBILI 0.5 05/14/2017  0000      Component Value Date/Time   TSH 3.320 01/23/2017 1206   TSH 0.997 08/01/2010 2213   Results for LELIA, JONS (MRN [id number]) as of 07/23/2017 07:52  Ref. Range 06/13/2017 10:01  Vitamin D, 25-Hydroxy Latest Ref Range: 30.0 - 100.0 ng/mL 66.2   ASSESSMENT AND PLAN: Prediabetes  Essential hypertension  Other fatigue  Class 3 severe obesity with serious comorbidity and body mass index (BMI) of 45.0 to 49.9 in adult, unspecified obesity type ([place])  PLAN:  Pre-Diabetes Shomari will continue to work on weight loss, exercise, and decreasing simple carbohydrates in her diet to help decrease the risk of diabetes. We dicussed metformin including benefits and risks. She was informed that eating too many simple carbohydrates or too many calories at one sitting increases the likelihood of GI side effects. Keyarra agrees to continue Victoza for now and a prescription was not written today. Jeily agreed to follow up with [place] as directed to monitor her progress.  Hypertension We discussed sodium restriction, working on healthy weight loss, and a regular exercise program as the means to achieve improved blood pressure control. Analisse agreed with this plan and agreed to follow up as directed. We will continue to monitor her blood pressure at the next appointment. We will continue to monitor her progress with the above lifestyle modifications. She will continue  amlodipine and edarbyclor as prescribed and will watch for signs of hypotension as she continues her lifestyle modifications.  Fatigue We will repeat indirect calorimetry today and Alette will follow up as directed.  We spent > than 50% of the 15 minute visit on the counseling as documented in the note.  Obesity Mayrani is currently in the action stage of change. As such, her goal is to continue with weight loss efforts She has agreed to keep a food journal with 1000 to 1200 calories and 80+ grams of protein daily with the Category 1 plan as a guide Kele has been instructed to work up to a goal of 150 minutes of combined cardio and strengthening exercise per week for weight loss and overall health benefits. We discussed the following Behavioral Modification Strategies today: better snacking choices, keeping a strict food journal, increasing lean protein intake, increasing vegetables and work on meal planning and easy cooking plans  Briann has agreed to follow up with our clinic in 2 weeks. She was informed of the importance of frequent follow up visits to maximize her success with intensive lifestyle modifications for her multiple health conditions.   OBESITY BEHAVIORAL INTERVENTION VISIT  Today's visit was # 12 out of 22.  Starting weight: 271 lbs Starting date: 01/22/17 Today's weight : 264 lbs Today's date: 07/22/2017 Total lbs lost to date: 7    ASK: We discussed the diagnosis of obesity with Roshanna Swim today and Melida agreed to give [place] permission to discuss obesity behavioral modification therapy today.  ASSESS: Corri has the diagnosis of obesity and her BMI today is 46.78 Jadon is in the action stage of change   ADVISE: Olesya was educated on the multiple health risks of obesity as well as the benefit of weight loss to improve her health. She was advised of the need for long term treatment and the importance of lifestyle  modifications.  AGREE: Multiple dietary modification options and treatment options were discussed and  Marciel agreed to the above obesity treatment plan.  Corey Skains, am acting as transcriptionist for Eber Jones, MD  I have reviewed the above  documentation for accuracy and completeness, and I agree with the above. - Ilene Qua, MD

## 2017-08-02 ENCOUNTER — Ambulatory Visit (INDEPENDENT_AMBULATORY_CARE_PROVIDER_SITE_OTHER): Payer: 59

## 2017-08-02 ENCOUNTER — Ambulatory Visit: Payer: 59 | Admitting: Podiatry

## 2017-08-02 DIAGNOSIS — B351 Tinea unguium: Secondary | ICD-10-CM | POA: Diagnosis not present

## 2017-08-02 DIAGNOSIS — M84375D Stress fracture, left foot, subsequent encounter for fracture with routine healing: Secondary | ICD-10-CM | POA: Diagnosis not present

## 2017-08-02 DIAGNOSIS — Z79899 Other long term (current) drug therapy: Secondary | ICD-10-CM | POA: Diagnosis not present

## 2017-08-02 MED ORDER — MELOXICAM 15 MG PO TABS: 15.0000 mg | ORAL_TABLET | Freq: Every day | ORAL | 0 refills | Status: AC

## 2017-08-06 ENCOUNTER — Ambulatory Visit (INDEPENDENT_AMBULATORY_CARE_PROVIDER_SITE_OTHER): Payer: 59 | Admitting: Family Medicine

## 2017-08-06 VITALS — BP 128/81 | HR 80 | Temp 98.2°F | Ht 63.0 in | Wt 262.0 lb

## 2017-08-06 DIAGNOSIS — Z6841 Body Mass Index (BMI) 40.0 and over, adult: Secondary | ICD-10-CM

## 2017-08-06 DIAGNOSIS — R7303 Prediabetes: Secondary | ICD-10-CM | POA: Diagnosis not present

## 2017-08-06 DIAGNOSIS — I10 Essential (primary) hypertension: Secondary | ICD-10-CM | POA: Diagnosis not present

## 2017-08-06 NOTE — Progress Notes (Signed)
Office: [phone]  /  Fax: [phone]   HPI:   Chief Complaint: OBESITY [name] is here to discuss her progress with her obesity treatment plan. She is on the keep a food journal with 1000 to 1200 calories and 80+ grams of protein daily and the Category 1 plan and is following her eating plan approximately 95 % of the time. She states she is walking 20 to 30 minutes 5 times per week. Tani found that after her last visit, she may have had a few hidden calories in her diet. She is still working on getting her protein goal. Her weight is 262 lb (118.8 kg) today and has had a weight loss of 2 pounds over a period of 2 weeks since her last visit. She has lost 9 lbs since starting treatment with [place].  Hypertension [name] is a 52 y.o. female with hypertension. [name] denies chest pain, chest pressure or headache. She is working weight loss to help control her blood pressure with the goal of decreasing her risk of heart attack and stroke. Antwoinettes blood pressure is controlled today.  Pre-Diabetes [name] has a diagnosis of prediabetes based on her elevated Hgb A1c and was informed this puts her at greater risk of developing diabetes. She is taking Victoza currently and continues to work on diet and exercise to decrease risk of diabetes. She admits to carb cravings. She admits hunger at night versus cravings and denies nausea or hypoglycemia.  ALLERGIES: Allergies  Allergen Reactions  . Contrast Media [Iodinated Diagnostic Agents] Nausea And Vomiting    Pt began vomiting after multihance injection     MEDICATIONS: Current Outpatient Medications on File Prior to Visit  Medication Sig Dispense Refill  . AMLODIPINE BESYLATE PO Take by mouth.    . Ascorbic Acid (VITAMIN C PO) Take by mouth.    . Azilsartan-Chlorthalidone (EDARBYCLOR PO) Take by mouth.    . Calcium 500-100 MG-UNIT CHEW Chew 1 tablet by mouth 2 (two) times daily.    . Insulin Pen Needle  (BD PEN NEEDLE NANO 2ND GEN) 32G X 4 MM MISC 1 Package by Does not apply route 2 (two) times daily. 100 each 0  . liraglutide (VICTOZA) 18 MG/3ML SOPN Inject 0.1 mLs (0.6 mg total) into the skin every morning. 2 pen 0  . meloxicam (MOBIC) 15 MG tablet Take 15 mg by mouth daily as needed for pain.    . meloxicam (MOBIC) 15 MG tablet Take 1 tablet (15 mg total) by mouth daily. 30 tablet 0  . Multiple Vitamin (MULTIVITAMIN WITH MINERALS) TABS tablet Take 1 tablet by mouth daily.    Marland Kitchen terbinafine (LAMISIL) 250 MG tablet Take 1 tablet (250 mg total) by mouth daily. 90 tablet 0  . Vitamin D, Ergocalciferol, (DRISDOL) 50000 units CAPS capsule Take 1 capsule (50,000 Units total) by mouth every 7 (seven) days. 4 capsule 0   No current facility-administered medications on file prior to visit.     PAST MEDICAL HISTORY: Past Medical History:  Diagnosis Date  . Anemia   . Constipation   . Gallbladder problem   . History of acute pancreatitis   . Hypertension   . Left shoulder pain   . Low back pain     PAST SURGICAL HISTORY: Past Surgical History:  Procedure Laterality Date  . ABDOMINAL HYSTERECTOMY    . ABDOMINAL HYSTERECTOMY    . CHOLECYSTECTOMY    . PANCREAS SURGERY      SOCIAL HISTORY: Social History   Tobacco  Use  . Smoking status: Former Smoker    Last attempt to quit: 01/01/1997    Years since quitting: 20.6  . Smokeless tobacco: Never Used  Substance Use Topics  . Alcohol use: Yes    Comment: occ  . Drug use: Not on file    FAMILY HISTORY: Family History  Problem Relation Age of Onset  . Hypertension Mother   . Hypertension Father   . Cancer Other     ROS: Review of Systems  Constitutional: Positive for weight loss.  Cardiovascular: Negative for chest pain.       Negative for chest pressure  Gastrointestinal: Negative for nausea.  Neurological: Negative for headaches.  Endo/Heme/Allergies:       Negative for hypoglycemia Positive for carb cravings Positive for  polyphagia    PHYSICAL EXAM: Blood pressure 128/81, pulse 80, temperature 98.2 F (36.8 C), temperature source Oral, height 5\' 3"  (1.6 m), weight 262 lb (118.8 kg), SpO2 95 %. Body mass index is 46.41 kg/m. Physical Exam  Constitutional: She is oriented to person, place, and time. She appears well-developed and well-nourished.  Cardiovascular: Normal rate.  Pulmonary/Chest: Effort normal.  Musculoskeletal: Normal range of motion.  Neurological: She is oriented to person, place, and time.  Skin: Skin is warm and dry.  Psychiatric: She has a normal mood and affect. Her behavior is normal.  Vitals reviewed.   RECENT LABS AND TESTS: BMET    Component Value Date/Time   NA 141 06/13/2017 1001   K 4.0 06/13/2017 1001   CL 98 06/13/2017 1001   CO2 26 06/13/2017 1001   GLUCOSE 85 06/13/2017 1001   GLUCOSE 91 08/05/2010 0540   BUN 13 06/13/2017 1001   CREATININE 0.85 06/13/2017 1001   CALCIUM 9.8 06/13/2017 1001   CALCIUM 9.1 08/01/2010 2213   GFRNONAA 80 06/13/2017 1001   GFRAA 92 06/13/2017 1001   Lab Results  Component Value Date   HGBA1C 5.9 (H) 06/13/2017   HGBA1C 6.1 (H) 01/23/2017   Lab Results  Component Value Date   INSULIN 21.8 06/13/2017   INSULIN 21.3 01/23/2017   CBC    Component Value Date/Time   WBC 6.5 05/14/2017 0000   RBC 5.79 (H) 05/14/2017 0000   HGB 13.7 05/14/2017 0000   HGB 13.1 01/23/2017 1206   HCT 41.6 05/14/2017 0000   HCT 39.5 01/23/2017 1206   PLT 146 05/14/2017 0000   MCV 71.8 (L) 05/14/2017 0000   MCV 71 (L) 01/23/2017 1206   MCH 23.7 (L) 05/14/2017 0000   MCHC 32.9 05/14/2017 0000   RDW 15.1 (H) 05/14/2017 0000   RDW 15.8 (H) 01/23/2017 1206   LYMPHSABS 2,581 05/14/2017 0000   LYMPHSABS 2.4 01/23/2017 1206   MONOABS 0.8 08/03/2010 0526   EOSABS 39 05/14/2017 0000   EOSABS 0.1 01/23/2017 1206   BASOSABS 13 05/14/2017 0000   BASOSABS 0.0 01/23/2017 1206   Iron/TIBC/Ferritin/ %Sat No results found for: IRON, TIBC, FERRITIN,  IRONPCTSAT Lipid Panel     Component Value Date/Time   CHOL 169 01/23/2017 1206   TRIG 48 01/23/2017 1206   HDL 62 01/23/2017 1206   CHOLHDL 3.1 08/01/2010 2213   VLDL 11 08/01/2010 2213   LDLCALC 97 01/23/2017 1206   Hepatic Function Panel     Component Value Date/Time   PROT 7.4 06/13/2017 1001   ALBUMIN 4.2 06/13/2017 1001   AST 13 06/13/2017 1001   ALT 14 06/13/2017 1001   ALKPHOS 102 06/13/2017 1001   BILITOT 0.5 06/13/2017 1001  BILIDIR 0.1 05/14/2017 0000   IBILI 0.5 05/14/2017 0000      Component Value Date/Time   TSH 3.320 01/23/2017 1206   TSH 0.997 08/01/2010 2213   Results for LARYSSA, HASSING (MRN [id number]) as of 08/06/2017 16:49  Ref. Range 06/13/2017 10:01  Vitamin D, 25-Hydroxy Latest Ref Range: 30.0 - 100.0 ng/mL 66.2   ASSESSMENT AND PLAN: Essential hypertension  Prediabetes  Class 3 severe obesity with serious comorbidity and body mass index (BMI) of 45.0 to 49.9 in adult, unspecified obesity type ([place])  PLAN:  Hypertension We discussed sodium restriction, working on healthy weight loss, and a regular exercise program as the means to achieve improved blood pressure control. Tacy agreed with this plan and agreed to follow up as directed. We will continue to monitor her blood pressure as well as her progress with the above lifestyle modifications. She will continue her medications as prescribed and will watch for signs of hypotension as she continues her lifestyle modifications.  Pre-Diabetes Ireanna will continue to work on weight loss, exercise, and decreasing simple carbohydrates in her diet to help decrease the risk of diabetes. She was informed that eating too many simple carbohydrates or too many calories at one sitting increases the likelihood of GI side effects. Dreama agrees to increase Victoza to 0.9 mg subQ daily. Dara agreed to follow up with [place] as directed to monitor her progress.  We spent > than 50% of the 15 minute  visit on the counseling as documented in the note.  Obesity Isela is currently in the action stage of change. As such, her goal is to continue with weight loss efforts She has agreed to keep a food journal with 1000 to 1200 calories and 80+ grams of protein daily Giavonni has been instructed to work up to a goal of 150 minutes of combined cardio and strengthening exercise per week for weight loss and overall health benefits. We discussed the following Behavioral Modification Strategies today: no skipping meals, better snacking choices, planning for success, keep a strict food journal, decrease eating out and work on meal planning and easy cooking plans  Lashara has agreed to follow up with our clinic in 2 weeks. She was informed of the importance of frequent follow up visits to maximize her success with intensive lifestyle modifications for her multiple health conditions.   OBESITY BEHAVIORAL INTERVENTION VISIT  Today's visit was # 13 out of 22.  Starting weight: 271 lbs Starting date: 01/23/17 Today's weight : 262 lbs  Today's date: 08/06/2017 Total lbs lost to date: 9    ASK: We discussed the diagnosis of obesity with Ingra Leinbach today and Kylin agreed to give [place] permission to discuss obesity behavioral modification therapy today.  ASSESS: Raseel has the diagnosis of obesity and her BMI today is 46.42 Dorethy is in the action stage of change   ADVISE: Zaraya was educated on the multiple health risks of obesity as well as the benefit of weight loss to improve her health. She was advised of the need for long term treatment and the importance of lifestyle modifications.  AGREE: Multiple dietary modification options and treatment options were discussed and  Sangita agreed to the above obesity treatment plan.  I, Doreene Nest, am acting as transcriptionist for Eber Jones, MD  I have reviewed the above documentation for accuracy  and completeness, and I agree with the above. - Ilene Qua, MD

## 2017-08-07 NOTE — Progress Notes (Signed)
Subjective: 52 year old female presents the office today for follow-up evaluation after starting Lamisil.  She has not had any side effects the medication she is tolerating it well.  She has noticed minimal improvement to the toenails.  Denies any pain in the nails and denies any redness or drainage.  In regards to the fifth metatarsal pain she is been out of the boot for the last 2 weeks.  The first week she states that she did well she no pain over this week she started to have some recurrence of pain.  Denies any increase in swelling denies any recent injury or trauma.  She has no other concerns today.  Denies any systemic complaints such as fevers, chills, nausea, vomiting. No acute changes since last appointment, and no other complaints at this time.   Objective: AAO x3, NAD DP/PT pulses palpable bilaterally, CRT less than 3 seconds Mild tenderness palpation of the fifth metatarsal base on the left foot.  There is no significant edema there is no erythema or increase in warmth.  Overall the tenderness is improved compared to what it was  however it started to come back.  N the nails had minimal improvement there is some mild clear on the proximal aspect but overall the nails are unchanged and there is no pain, signs of infection.   O open lesions or pre-ulcerative lesions.  No pain with calf compression, swelling, warmth, erythema  Assessment: Subacute fracture fifth metatarsal base left foot with onychomycosis  Plan: -All treatment options discussed with the patient including all alternatives, risks, complications.  -In regard to the nail fungus would continue Lamisil.  We will recheck a CBC and LFT.  Continue to monitoring side effects of medication. -In regards to the fifth metatarsal base pain we discussed a steroid injection.  As before her vacation should she need it.  For now we will do meloxicam discussed ice the area as well as wearing supportive shoes and offloading to the area.   Discussed orthotics. -Patient encouraged to call the office with any questions, concerns, change in symptoms.   Trula Slade DPM

## 2017-08-11 ENCOUNTER — Encounter (INDEPENDENT_AMBULATORY_CARE_PROVIDER_SITE_OTHER): Payer: Self-pay | Admitting: Family Medicine

## 2017-08-13 ENCOUNTER — Other Ambulatory Visit (INDEPENDENT_AMBULATORY_CARE_PROVIDER_SITE_OTHER): Payer: Self-pay

## 2017-08-13 DIAGNOSIS — R7303 Prediabetes: Secondary | ICD-10-CM

## 2017-08-13 MED ORDER — LIRAGLUTIDE 18 MG/3ML ~~LOC~~ SOPN: 0.6000 mg | PEN_INJECTOR | SUBCUTANEOUS | 0 refills | Status: DC

## 2017-08-22 DIAGNOSIS — Z79899 Other long term (current) drug therapy: Secondary | ICD-10-CM | POA: Diagnosis not present

## 2017-08-23 ENCOUNTER — Telehealth: Payer: Self-pay | Admitting: *Deleted

## 2017-08-23 LAB — CBC WITH DIFFERENTIAL/PLATELET
BASOS PCT: 0.4 %
Basophils Absolute: 20 cells/uL (ref 0–200)
EOS PCT: 1 %
Eosinophils Absolute: 49 cells/uL (ref 15–500)
HEMATOCRIT: 42.2 % (ref 35.0–45.0)
Hemoglobin: 13.3 g/dL (ref 11.7–15.5)
LYMPHS ABS: 2073 {cells}/uL (ref 850–3900)
MCH: 23.8 pg — ABNORMAL LOW (ref 27.0–33.0)
MCHC: 31.5 g/dL — ABNORMAL LOW (ref 32.0–36.0)
MCV: 75.5 fL — ABNORMAL LOW (ref 80.0–100.0)
MPV: 11 fL (ref 7.5–12.5)
Monocytes Relative: 7.3 %
NEUTROS PCT: 49 %
Neutro Abs: 2401 cells/uL (ref 1500–7800)
PLATELETS: 182 10*3/uL (ref 140–400)
RBC: 5.59 10*6/uL — AB (ref 3.80–5.10)
RDW: 15.2 % — ABNORMAL HIGH (ref 11.0–15.0)
Total Lymphocyte: 42.3 %
WBC mixed population: 358 cells/uL (ref 200–950)
WBC: 4.9 10*3/uL (ref 3.8–10.8)

## 2017-08-23 LAB — HEPATIC FUNCTION PANEL
AG Ratio: 1.5 (calc) (ref 1.0–2.5)
ALBUMIN MSPROF: 4.1 g/dL (ref 3.6–5.1)
ALT: 15 U/L (ref 6–29)
AST: 13 U/L (ref 10–35)
Alkaline phosphatase (APISO): 92 U/L (ref 33–130)
BILIRUBIN DIRECT: 0.1 mg/dL (ref 0.0–0.2)
Globulin: 2.8 g/dL (calc) (ref 1.9–3.7)
Indirect Bilirubin: 0.3 mg/dL (calc) (ref 0.2–1.2)
Total Bilirubin: 0.4 mg/dL (ref 0.2–1.2)
Total Protein: 6.9 g/dL (ref 6.1–8.1)

## 2017-08-23 NOTE — Telephone Encounter (Signed)
I informed pt of Dr. Wagoner's review of results and orders. 

## 2017-08-23 NOTE — Telephone Encounter (Signed)
-----   Message from Trula Slade, DPM sent at 08/23/2017  1:46 PM EDT ----- Please let her know that her labs are stable and can continue Lamisil. Thanks.

## 2017-08-26 ENCOUNTER — Ambulatory Visit (INDEPENDENT_AMBULATORY_CARE_PROVIDER_SITE_OTHER): Payer: 59 | Admitting: Family Medicine

## 2017-08-26 VITALS — BP 116/78 | HR 88 | Temp 98.5°F | Ht 63.0 in | Wt 259.0 lb

## 2017-08-26 DIAGNOSIS — Z6841 Body Mass Index (BMI) 40.0 and over, adult: Secondary | ICD-10-CM

## 2017-08-26 DIAGNOSIS — I10 Essential (primary) hypertension: Secondary | ICD-10-CM

## 2017-08-26 DIAGNOSIS — R7303 Prediabetes: Secondary | ICD-10-CM

## 2017-08-26 NOTE — Progress Notes (Signed)
Office: [phone]  /  Fax: [phone]   HPI:   Chief Complaint: OBESITY [name] is here to discuss her progress with her obesity treatment plan. She is on the keep a food journal with 1000-1200 calories and 80+ grams of protein daily and is following her eating plan approximately 90 % of the time. She states she is walking for 10-15 minutes 3 times per week. [name] has had a good couple of weeks. She didn't hit her protein goal just 1 day but always stays within calories. She is going on a cruise September 15th-22nd (leaving September 11th).  Her weight is 259 lb (117.5 kg) today and has had a weight loss of 3 pounds over a period of 3 weeks since her last visit. She has lost 12 lbs since starting treatment with [place].  Hypertension [name] is a 52 y.o. female with hypertension. Biance's blood pressure is controlled today. She denies chest pain, chest pressure, or headache. She is working weight loss to help control her blood pressure with the goal of decreasing her risk of heart attack and stroke.   Pre-Diabetes [name] has a diagnosis of pre-diabetes based on her elevated Hgb A1c and was informed this puts her at greater risk of developing diabetes. She is doing well on Victoza and she denies nausea, vomiting, or abdominal pain. She continues to work on diet and exercise to decrease risk of diabetes. She denies hypoglycemia.  ALLERGIES: Allergies  Allergen Reactions  . Contrast Media [Iodinated Diagnostic Agents] Nausea And Vomiting    Pt began vomiting after multihance injection     MEDICATIONS: Current Outpatient Medications on File Prior to Visit  Medication Sig Dispense Refill  . AMLODIPINE BESYLATE PO Take by mouth.    . Ascorbic Acid (VITAMIN C PO) Take by mouth.    . Azilsartan-Chlorthalidone (EDARBYCLOR PO) Take by mouth.    . Calcium 500-100 MG-UNIT CHEW Chew 1 tablet by mouth 2 (two) times daily.    . Insulin Pen Needle (BD PEN NEEDLE NANO  2ND GEN) 32G X 4 MM MISC 1 Package by Does not apply route 2 (two) times daily. 100 each 0  . liraglutide (VICTOZA) 18 MG/3ML SOPN Inject 0.1 mLs (0.6 mg total) into the skin every morning. 2 pen 0  . meloxicam (MOBIC) 15 MG tablet Take 15 mg by mouth daily as needed for pain.    . meloxicam (MOBIC) 15 MG tablet Take 1 tablet (15 mg total) by mouth daily. 30 tablet 0  . Multiple Vitamin (MULTIVITAMIN WITH MINERALS) TABS tablet Take 1 tablet by mouth daily.    Marland Kitchen terbinafine (LAMISIL) 250 MG tablet Take 1 tablet (250 mg total) by mouth daily. 90 tablet 0  . Vitamin D, Ergocalciferol, (DRISDOL) 50000 units CAPS capsule Take 1 capsule (50,000 Units total) by mouth every 7 (seven) days. 4 capsule 0   No current facility-administered medications on file prior to visit.     PAST MEDICAL HISTORY: Past Medical History:  Diagnosis Date  . Anemia   . Constipation   . Gallbladder problem   . History of acute pancreatitis   . Hypertension   . Left shoulder pain   . Low back pain     PAST SURGICAL HISTORY: Past Surgical History:  Procedure Laterality Date  . ABDOMINAL HYSTERECTOMY    . ABDOMINAL HYSTERECTOMY    . CHOLECYSTECTOMY    . PANCREAS SURGERY      SOCIAL HISTORY: Social History   Tobacco Use  . Smoking status: Former  Smoker    Last attempt to quit: 01/01/1997    Years since quitting: 20.6  . Smokeless tobacco: Never Used  Substance Use Topics  . Alcohol use: Yes    Comment: occ  . Drug use: Not on file    FAMILY HISTORY: Family History  Problem Relation Age of Onset  . Hypertension Mother   . Hypertension Father   . Cancer Other     ROS: Review of Systems  Constitutional: Positive for weight loss.  Cardiovascular: Negative for chest pain.       Negative chest pressure  Gastrointestinal: Negative for abdominal pain, nausea and vomiting.  Neurological: Negative for headaches.  Endo/Heme/Allergies:       Negative hypoglycemia    PHYSICAL EXAM: Blood pressure  116/78, pulse 88, temperature 98.5 F (36.9 C), temperature source Oral, height 5\' 3"  (1.6 m), weight 259 lb (117.5 kg), SpO2 97 %. Body mass index is 45.88 kg/m. Physical Exam  Constitutional: She is oriented to person, place, and time. She appears well-developed and well-nourished.  Cardiovascular: Normal rate.  Pulmonary/Chest: Effort normal.  Musculoskeletal: Normal range of motion.  Neurological: She is oriented to person, place, and time.  Skin: Skin is warm and dry.  Psychiatric: She has a normal mood and affect. Her behavior is normal.  Vitals reviewed.   RECENT LABS AND TESTS: BMET    Component Value Date/Time   NA 141 06/13/2017 1001   K 4.0 06/13/2017 1001   CL 98 06/13/2017 1001   CO2 26 06/13/2017 1001   GLUCOSE 85 06/13/2017 1001   GLUCOSE 91 08/05/2010 0540   BUN 13 06/13/2017 1001   CREATININE 0.85 06/13/2017 1001   CALCIUM 9.8 06/13/2017 1001   CALCIUM 9.1 08/01/2010 2213   GFRNONAA 80 06/13/2017 1001   GFRAA 92 06/13/2017 1001   Lab Results  Component Value Date   HGBA1C 5.9 (H) 06/13/2017   HGBA1C 6.1 (H) 01/23/2017   Lab Results  Component Value Date   INSULIN 21.8 06/13/2017   INSULIN 21.3 01/23/2017   CBC    Component Value Date/Time   WBC 4.9 08/22/2017 0955   RBC 5.59 (H) 08/22/2017 0955   HGB 13.3 08/22/2017 0955   HGB 13.1 01/23/2017 1206   HCT 42.2 08/22/2017 0955   HCT 39.5 01/23/2017 1206   PLT 182 08/22/2017 0955   MCV 75.5 (L) 08/22/2017 0955   MCV 71 (L) 01/23/2017 1206   MCH 23.8 (L) 08/22/2017 0955   MCHC 31.5 (L) 08/22/2017 0955   RDW 15.2 (H) 08/22/2017 0955   RDW 15.8 (H) 01/23/2017 1206   LYMPHSABS 2,073 08/22/2017 0955   LYMPHSABS 2.4 01/23/2017 1206   MONOABS 0.8 08/03/2010 0526   EOSABS 49 08/22/2017 0955   EOSABS 0.1 01/23/2017 1206   BASOSABS 20 08/22/2017 0955   BASOSABS 0.0 01/23/2017 1206   Iron/TIBC/Ferritin/ %Sat No results found for: IRON, TIBC, FERRITIN, IRONPCTSAT Lipid Panel     Component Value  Date/Time   CHOL 169 01/23/2017 1206   TRIG 48 01/23/2017 1206   HDL 62 01/23/2017 1206   CHOLHDL 3.1 08/01/2010 2213   VLDL 11 08/01/2010 2213   LDLCALC 97 01/23/2017 1206   Hepatic Function Panel     Component Value Date/Time   PROT 6.9 08/22/2017 0955   PROT 7.4 06/13/2017 1001   ALBUMIN 4.2 06/13/2017 1001   AST 13 08/22/2017 0955   ALT 15 08/22/2017 0955   ALKPHOS 102 06/13/2017 1001   BILITOT 0.4 08/22/2017 0955   BILITOT 0.5  06/13/2017 1001   BILIDIR 0.1 08/22/2017 0955   IBILI 0.3 08/22/2017 0955      Component Value Date/Time   TSH 3.320 01/23/2017 1206   TSH 0.997 08/01/2010 2213    ASSESSMENT AND PLAN: Essential hypertension  Prediabetes  Class 3 severe obesity with serious comorbidity and body mass index (BMI) of 45.0 to 49.9 in adult, unspecified obesity type ([place])  PLAN:  Hypertension We discussed sodium restriction, working on healthy weight loss, and a regular exercise program as the means to achieve improved blood pressure control. Any agreed with this plan and agreed to follow up as directed. We will continue to monitor her blood pressure as well as her progress with the above lifestyle modifications. She will continue her medications and will watch for signs of hypotension as she continues her lifestyle modifications. [name] agrees to follow up with our clinic in 2 weeks.  Pre-Diabetes [name] will continue to work on weight loss, exercise, and decreasing simple carbohydrates in her diet to help decrease the risk of diabetes. We dicussed metformin including benefits and risks. She was informed that eating too many simple carbohydrates or too many calories at one sitting increases the likelihood of GI side effects. [name] agrees to continue Victoza and she agrees to follow up with our clinic in 2 weeks as directed to monitor her progress.  We spent > than 50% of the 15 minute visit on the counseling as documented in the  note.  Obesity [name] is currently in the action stage of change. As such, her goal is to continue with weight loss efforts She has agreed to keep a food journal with 1000-1200 calories and 80+ grams of protein daily [name] has been instructed to work up to a goal of 150 minutes of combined cardio and strengthening exercise per week for weight loss and overall health benefits. We discussed the following Behavioral Modification Strategies today: increasing lean protein intake, increasing vegetables, work on meal planning and easy cooking plans, better snacking choices, and planning for success   [name] has agreed to follow up with our clinic in 2 weeks. She was informed of the importance of frequent follow up visits to maximize her success with intensive lifestyle modifications for her multiple health conditions.   OBESITY BEHAVIORAL INTERVENTION VISIT  Today's visit was # 14.   Starting weight: 271 lbs Starting date: 01/23/17 Today's weight : 259 lbs  Today's date: 08/26/2017 Total lbs lost to date: 12 At least 15 minutes were spent on discussing the following behavioral intervention visit.   ASK: We discussed the diagnosis of obesity with [name] today and [name] agreed to give [place] permission to discuss obesity behavioral modification therapy today.  ASSESS: Jaionna has the diagnosis of obesity and her BMI today is 45.89 Marjean is in the action stage of change   ADVISE: [name] was educated on the multiple health risks of obesity as well as the benefit of weight loss to improve her health. She was advised of the need for long term treatment and the importance of lifestyle modifications to improve her current health and to decrease her risk of future health problems.  AGREE: Multiple dietary modification options and treatment options were discussed and  [name] agreed to follow the recommendations documented in the above  note.  ARRANGE: [name] was educated on the importance of frequent visits to treat obesity as outlined per CMS and USPSTF guidelines and agreed to schedule her next follow up appointment today.  [name], am acting  as transcriptionist for Ilene Qua, MD  I have reviewed the above documentation for accuracy and completeness, and I agree with the above. - Ilene Qua, MD

## 2017-08-30 ENCOUNTER — Ambulatory Visit: Payer: 59 | Admitting: Podiatry

## 2017-08-30 ENCOUNTER — Encounter: Payer: Self-pay | Admitting: Podiatry

## 2017-08-30 DIAGNOSIS — M79672 Pain in left foot: Secondary | ICD-10-CM | POA: Diagnosis not present

## 2017-08-30 DIAGNOSIS — B351 Tinea unguium: Secondary | ICD-10-CM

## 2017-08-30 DIAGNOSIS — Z79899 Other long term (current) drug therapy: Secondary | ICD-10-CM

## 2017-08-30 MED ORDER — KETOCONAZOLE 2 % EX CREA: 1.0000 "application " | TOPICAL_CREAM | Freq: Every day | CUTANEOUS | 2 refills | Status: DC

## 2017-08-30 MED ORDER — TERBINAFINE HCL 250 MG PO TABS: 250.0000 mg | ORAL_TABLET | Freq: Every day | ORAL | 0 refills | Status: DC

## 2017-09-03 ENCOUNTER — Telehealth: Payer: Self-pay | Admitting: Podiatry

## 2017-09-03 DIAGNOSIS — E6609 Other obesity due to excess calories: Secondary | ICD-10-CM | POA: Diagnosis not present

## 2017-09-03 DIAGNOSIS — D649 Anemia, unspecified: Secondary | ICD-10-CM | POA: Diagnosis not present

## 2017-09-03 MED ORDER — TERBINAFINE HCL 250 MG PO TABS: 250.0000 mg | ORAL_TABLET | Freq: Every day | ORAL | 0 refills | Status: DC

## 2017-09-03 NOTE — Telephone Encounter (Signed)
Left message informing pt the medication had been called to the [facility] on [address] at 1:24pm.

## 2017-09-03 NOTE — Addendum Note (Signed)
Addended by: Harriett Sine D on: 09/03/2017 01:28 PM   Modules accepted: Orders

## 2017-09-03 NOTE — Telephone Encounter (Signed)
[place] did not received prescription for Lamisil. Please call the pharmacy and patient.

## 2017-09-04 DIAGNOSIS — B351 Tinea unguium: Secondary | ICD-10-CM | POA: Insufficient documentation

## 2017-09-04 NOTE — Progress Notes (Signed)
Subjective: 52 year old female presents the office today for follow-up evaluation of nail fungus.  She is currently on Lamisil and not expensing any side effects the medication.  She also states that her foot overall has been feeling better on the left side.  She has no pain today.  She states she will occasionally get some discomfort in her activity level but is been much better.  No swelling or any recent injury or changes.  She has no other concerns today. Denies any systemic complaints such as fevers, chills, nausea, vomiting. No acute changes since last appointment, and no other complaints at this time.   Objective: AAO x3, NAD DP/PT pulses palpable bilaterally, CRT less than 3 seconds At this time there is no tenderness palpation of the fifth metatarsal base of the left foot.  The peroneal tendons appear to be intact.   The nails continue be hypertrophic, dystrophic, discolored with yellow-brown discoloration there is some mild clear on the proximal aspect.  No pain in the nails there is no surrounding redness or drainage or any signs of infection.  No open lesions or pre-ulcerative lesions.  No pain with calf compression, swelling, warmth, erythema  Assessment: Onychomycosis, currently on Lamisil with resolving left fifth metatarsal base pain  Plan: -All treatment options discussed with the patient including all alternatives, risks, complications.  -In regards to left foot pain she is doing well.  Hold off on steroid injection.  Continue with wearing supportive shoes. -She is doing well on the Lamisil.  Continue with this.  Recheck a CBC and LFT today.  Continue to monitor for any side effects. -Patient encouraged to call the office with any questions, concerns, change in symptoms.   Trula Slade DPM

## 2017-09-11 ENCOUNTER — Ambulatory Visit (INDEPENDENT_AMBULATORY_CARE_PROVIDER_SITE_OTHER): Payer: 59 | Admitting: Family Medicine

## 2017-09-11 VITALS — BP 115/80 | HR 94 | Temp 98.2°F | Ht 63.0 in | Wt 258.0 lb

## 2017-09-11 DIAGNOSIS — R7303 Prediabetes: Secondary | ICD-10-CM

## 2017-09-11 DIAGNOSIS — E559 Vitamin D deficiency, unspecified: Secondary | ICD-10-CM

## 2017-09-11 DIAGNOSIS — Z6841 Body Mass Index (BMI) 40.0 and over, adult: Secondary | ICD-10-CM

## 2017-09-12 NOTE — Progress Notes (Signed)
Office: [phone]  /  Fax: [phone]   HPI:   Chief Complaint: OBESITY [name] is here to discuss her progress with her obesity treatment plan. She is on the keep a food journal with 1000 to 1200 calories and 80+ grams of protein daily and is following her eating plan approximately 93 % of the time. She states she is walking 10 to 15 minutes 3 times per week. [name] is leaving for vacation today. She is first going to [place] and then [place] for a cruise. She is about to launch an [name]. [name] is consistently getting calories, but she is not always hitting her protein goal. Her weight is 258 lb (117 kg) today and has had a weight loss of 1 pound over a period of 2 weeks since her last visit. She has lost 13 lbs since starting treatment with [place].  Pre-Diabetes [name] has a diagnosis of prediabetes based on her elevated Hgb A1c and was informed this puts her at greater risk of developing diabetes. [name] is still having occasional carb cravings and she denies any GI upset with metformin or victoza. She continues to work on diet and exercise to decrease risk of diabetes. She denies hypoglycemia.  Vitamin D deficiency [name] has a diagnosis of vitamin D deficiency. [name] is currently taking vit D and she admits to fatigue, but denies nausea, vomiting or muscle weakness.  ALLERGIES: Allergies  Allergen Reactions  . Contrast Media [Iodinated Diagnostic Agents] Nausea And Vomiting    Pt began vomiting after multihance injection     MEDICATIONS: Current Outpatient Medications on File Prior to Visit  Medication Sig Dispense Refill  . AMLODIPINE BESYLATE PO Take by mouth.    . Ascorbic Acid (VITAMIN C PO) Take by mouth.    . Azilsartan-Chlorthalidone (EDARBYCLOR PO) Take by mouth.    . Calcium 500-100 MG-UNIT CHEW Chew 1 tablet by mouth 2 (two) times daily.    . Insulin Pen Needle (BD PEN NEEDLE NANO 2ND GEN) 32G X 4 MM MISC 1  Package by Does not apply route 2 (two) times daily. 100 each 0  . ketoconazole (NIZORAL) 2 % cream Apply 1 application topically daily. 60 g 2  . liraglutide (VICTOZA) 18 MG/3ML SOPN Inject 0.1 mLs (0.6 mg total) into the skin every morning. 2 pen 0  . meloxicam (MOBIC) 15 MG tablet Take 15 mg by mouth daily as needed for pain.    . meloxicam (MOBIC) 15 MG tablet Take 1 tablet (15 mg total) by mouth daily. 30 tablet 0  . Multiple Vitamin (MULTIVITAMIN WITH MINERALS) TABS tablet Take 1 tablet by mouth daily.    Marland Kitchen terbinafine (LAMISIL) 250 MG tablet Take 1 tablet (250 mg total) by mouth daily. 90 tablet 0  . terbinafine (LAMISIL) 250 MG tablet Take 1 tablet (250 mg total) by mouth daily. 30 tablet 0  . Vitamin D, Ergocalciferol, (DRISDOL) 50000 units CAPS capsule Take 1 capsule (50,000 Units total) by mouth every 7 (seven) days. 4 capsule 0   No current facility-administered medications on file prior to visit.     PAST MEDICAL HISTORY: Past Medical History:  Diagnosis Date  . Anemia   . Constipation   . Gallbladder problem   . History of acute pancreatitis   . Hypertension   . Left shoulder pain   . Low back pain     PAST SURGICAL HISTORY: Past Surgical History:  Procedure Laterality Date  . ABDOMINAL HYSTERECTOMY    . ABDOMINAL HYSTERECTOMY    .  CHOLECYSTECTOMY    . PANCREAS SURGERY      SOCIAL HISTORY: Social History   Tobacco Use  . Smoking status: Former Smoker    Last attempt to quit: 01/01/1997    Years since quitting: 20.7  . Smokeless tobacco: Never Used  Substance Use Topics  . Alcohol use: Yes    Comment: occ  . Drug use: Not on file    FAMILY HISTORY: Family History  Problem Relation Age of Onset  . Hypertension Mother   . Hypertension Father   . Cancer Other     ROS: Review of Systems  Constitutional: Positive for malaise/fatigue and weight loss.  Gastrointestinal: Negative for diarrhea, nausea and vomiting.  Musculoskeletal:       Negative for  muscle weakness   Endo/Heme/Allergies:       Positive for carb cravings Negative for hypoglycemia    PHYSICAL EXAM: Blood pressure 115/80, pulse 94, temperature 98.2 F (36.8 C), temperature source Oral, height 5\' 3"  (1.6 m), weight 258 lb (117 kg), SpO2 99 %. Body mass index is 45.7 kg/m. Physical Exam  Constitutional: She is oriented to person, place, and time. She appears well-developed and well-nourished.  Cardiovascular: Normal rate.  Pulmonary/Chest: Effort normal.  Musculoskeletal: Normal range of motion.  Neurological: She is oriented to person, place, and time.  Skin: Skin is warm and dry.  Psychiatric: She has a normal mood and affect. Her behavior is normal.  Vitals reviewed.   RECENT LABS AND TESTS: BMET    Component Value Date/Time   NA 141 06/13/2017 1001   K 4.0 06/13/2017 1001   CL 98 06/13/2017 1001   CO2 26 06/13/2017 1001   GLUCOSE 85 06/13/2017 1001   GLUCOSE 91 08/05/2010 0540   BUN 13 06/13/2017 1001   CREATININE 0.85 06/13/2017 1001   CALCIUM 9.8 06/13/2017 1001   CALCIUM 9.1 08/01/2010 2213   GFRNONAA 80 06/13/2017 1001   GFRAA 92 06/13/2017 1001   Lab Results  Component Value Date   HGBA1C 5.9 (H) 06/13/2017   HGBA1C 6.1 (H) 01/23/2017   Lab Results  Component Value Date   INSULIN 21.8 06/13/2017   INSULIN 21.3 01/23/2017   CBC    Component Value Date/Time   WBC 4.9 08/22/2017 0955   RBC 5.59 (H) 08/22/2017 0955   HGB 13.3 08/22/2017 0955   HGB 13.1 01/23/2017 1206   HCT 42.2 08/22/2017 0955   HCT 39.5 01/23/2017 1206   PLT 182 08/22/2017 0955   MCV 75.5 (L) 08/22/2017 0955   MCV 71 (L) 01/23/2017 1206   MCH 23.8 (L) 08/22/2017 0955   MCHC 31.5 (L) 08/22/2017 0955   RDW 15.2 (H) 08/22/2017 0955   RDW 15.8 (H) 01/23/2017 1206   LYMPHSABS 2,073 08/22/2017 0955   LYMPHSABS 2.4 01/23/2017 1206   MONOABS 0.8 08/03/2010 0526   EOSABS 49 08/22/2017 0955   EOSABS 0.1 01/23/2017 1206   BASOSABS 20 08/22/2017 0955   BASOSABS 0.0  01/23/2017 1206   Iron/TIBC/Ferritin/ %Sat No results found for: IRON, TIBC, FERRITIN, IRONPCTSAT Lipid Panel     Component Value Date/Time   CHOL 169 01/23/2017 1206   TRIG 48 01/23/2017 1206   HDL 62 01/23/2017 1206   CHOLHDL 3.1 08/01/2010 2213   VLDL 11 08/01/2010 2213   LDLCALC 97 01/23/2017 1206   Hepatic Function Panel     Component Value Date/Time   PROT 6.9 08/22/2017 0955   PROT 7.4 06/13/2017 1001   ALBUMIN 4.2 06/13/2017 1001   AST 13  08/22/2017 0955   ALT 15 08/22/2017 0955   ALKPHOS 102 06/13/2017 1001   BILITOT 0.4 08/22/2017 0955   BILITOT 0.5 06/13/2017 1001   BILIDIR 0.1 08/22/2017 0955   IBILI 0.3 08/22/2017 0955      Component Value Date/Time   TSH 3.320 01/23/2017 1206   TSH 0.997 08/01/2010 2213   Results for ALKA, FALWELL (MRN [id number]) as of 09/12/2017 09:33  Ref. Range 06/13/2017 10:01  Vitamin D, 25-Hydroxy Latest Ref Range: 30.0 - 100.0 ng/mL 66.2   ASSESSMENT AND PLAN: Prediabetes  Vitamin D deficiency  Class 3 severe obesity with serious comorbidity and body mass index (BMI) of 45.0 to 49.9 in adult, unspecified obesity type ([place])  PLAN:  Pre-Diabetes [name] will continue to work on weight loss, exercise, and decreasing simple carbohydrates in her diet to help decrease the risk of diabetes. We dicussed metformin including benefits and risks. She was informed that eating too many simple carbohydrates or too many calories at one sitting increases the likelihood of GI side effects. [name] will continue metformin and victoza and follow up with [place] as directed to monitor her progress.  Vitamin D Deficiency [name] was informed that low vitamin D levels contributes to fatigue and are associated with obesity, breast, and colon cancer. She agrees to continue to take prescription Vit D @50 ,000 IU every week and will follow up for routine testing of vitamin D, at least 2-3 times per year. She was informed of the risk of  over-replacement of vitamin D and agrees to not increase her dose unless she discusses this with [place] first.  I spent > than 50% of the 15 minute visit on counseling as documented in the note.  Obesity [name] is currently in the action stage of change. As such, her goal is to continue with weight loss efforts She has agreed to keep a food journal with 1000 to 1200 calories and 80+ grams of protein daily [name] has been instructed to work up to a goal of 150 minutes of combined cardio and strengthening exercise per week for weight loss and overall health benefits. We discussed the following Behavioral Modification Strategies today: increasing lean protein intake, increasing vegetables, work on meal planning and easy cooking plans and travel eating strategies   [name] has agreed to follow up with our clinic in 2 weeks. She was informed of the importance of frequent follow up visits to maximize her success with intensive lifestyle modifications for her multiple health conditions.   OBESITY BEHAVIORAL INTERVENTION VISIT  Today's visit was # 15   Starting weight: 271 lbs Starting date: 01/23/17 Today's weight : 258 lbs Today's date: 09/11/2017 Total lbs lost to date: 65   ASK: We discussed the diagnosis of obesity with [name] today and [name] agreed to give [place] permission to discuss obesity behavioral modification therapy today.  ASSESS: [name] has the diagnosis of obesity and her BMI today is 45.71 [name] is in the action stage of change   ADVISE: [name] was educated on the multiple health risks of obesity as well as the benefit of weight loss to improve her health. She was advised of the need for long term treatment and the importance of lifestyle modifications to improve her current health and to decrease her risk of future health problems.  AGREE: Multiple dietary modification options and treatment options were discussed and  [name]  agreed to follow the recommendations documented in the above note.  ARRANGE: [name] was educated on the importance of frequent visits to  treat obesity as outlined per CMS and USPSTF guidelines and agreed to schedule her next follow up appointment today.  I, Doreene Nest, am acting as transcriptionist for Eber Jones, MD  I have reviewed the above documentation for accuracy and completeness, and I agree with the above. - Ilene Qua, MD

## 2017-09-26 ENCOUNTER — Ambulatory Visit (INDEPENDENT_AMBULATORY_CARE_PROVIDER_SITE_OTHER): Payer: 59 | Admitting: Family Medicine

## 2018-03-17 ENCOUNTER — Ambulatory Visit: Payer: 59 | Admitting: Podiatry

## 2018-03-27 DIAGNOSIS — I10 Essential (primary) hypertension: Secondary | ICD-10-CM | POA: Diagnosis not present

## 2018-03-27 DIAGNOSIS — R7303 Prediabetes: Secondary | ICD-10-CM | POA: Diagnosis not present

## 2018-04-28 DIAGNOSIS — R7303 Prediabetes: Secondary | ICD-10-CM | POA: Diagnosis not present

## 2018-04-28 DIAGNOSIS — I10 Essential (primary) hypertension: Secondary | ICD-10-CM | POA: Diagnosis not present

## 2018-04-28 DIAGNOSIS — E782 Mixed hyperlipidemia: Secondary | ICD-10-CM | POA: Diagnosis not present

## 2018-05-05 DIAGNOSIS — I10 Essential (primary) hypertension: Secondary | ICD-10-CM | POA: Diagnosis not present

## 2018-05-05 DIAGNOSIS — E782 Mixed hyperlipidemia: Secondary | ICD-10-CM | POA: Diagnosis not present

## 2018-09-10 ENCOUNTER — Ambulatory Visit (INDEPENDENT_AMBULATORY_CARE_PROVIDER_SITE_OTHER): Payer: 59

## 2018-09-10 ENCOUNTER — Encounter: Payer: Self-pay | Admitting: Podiatry

## 2018-09-10 ENCOUNTER — Ambulatory Visit (INDEPENDENT_AMBULATORY_CARE_PROVIDER_SITE_OTHER): Payer: 59 | Admitting: Podiatry

## 2018-09-10 ENCOUNTER — Other Ambulatory Visit: Payer: Self-pay

## 2018-09-10 ENCOUNTER — Other Ambulatory Visit: Payer: Self-pay | Admitting: Podiatry

## 2018-09-10 DIAGNOSIS — S93401A Sprain of unspecified ligament of right ankle, initial encounter: Secondary | ICD-10-CM | POA: Diagnosis not present

## 2018-09-10 DIAGNOSIS — M79671 Pain in right foot: Secondary | ICD-10-CM

## 2018-09-10 DIAGNOSIS — R600 Localized edema: Secondary | ICD-10-CM | POA: Diagnosis not present

## 2018-09-13 NOTE — Progress Notes (Signed)
Subjective:   Patient ID: [name], female   DOB: 53 y.o.   MRN: [id number]   HPI Patient presents with high ankle sprain right stating that it is been very sore and she cannot bear weight and she is worried about fracture and the amount of pain she is experience   ROS      Objective:  Physical Exam  Neurovascular status intact negative Homans sign was noted with inability to move the right ankle at all with quite a bit of swelling in the ankle region mostly on the lateral side with pain extending to the lower leg lateral side     Assessment:  Probability for severe ankle sprain right and cannot rule out fracture     Plan:  H&P reviewed condition and x-ray and today applied Unna boot to reduce swelling along with air fracture walker to immobilize and advised on nonweightbearing with gradual rate weightbearing to be started over the next few days and to leave the Unna boot on for 3 days or take off earlier if needed.  Reappoint for [place] to recheck  X-ray was negative for fracture diastases injury appears to be soft tissue wound

## 2018-09-23 ENCOUNTER — Ambulatory Visit (INDEPENDENT_AMBULATORY_CARE_PROVIDER_SITE_OTHER): Payer: 59 | Admitting: Podiatry

## 2018-09-23 ENCOUNTER — Encounter: Payer: Self-pay | Admitting: Podiatry

## 2018-09-23 ENCOUNTER — Other Ambulatory Visit: Payer: Self-pay

## 2018-09-23 DIAGNOSIS — S93401A Sprain of unspecified ligament of right ankle, initial encounter: Secondary | ICD-10-CM | POA: Diagnosis not present

## 2018-09-23 DIAGNOSIS — R600 Localized edema: Secondary | ICD-10-CM | POA: Diagnosis not present

## 2018-09-29 NOTE — Progress Notes (Signed)
Subjective:   Patient ID: [name], female   DOB: 53 y.o.   MRN: [id number]   HPI Patient states still getting some swelling but overall I feel like I am improving and that the boot seem to really help me   ROS      Objective:  Physical Exam  Neurovascular status intact with patient's foot reduced swelling but still present with +1 pitting edema negative Homans sign noted     Assessment:  Improving but still has swelling a little beyond what I would like to see     Plan:  Reviewed condition continued elevation compression reapplied Unna boot to try to keep pressure off the foot and reappoint 2 weeks or earlier if any issues were to occur

## 2018-09-30 DIAGNOSIS — M79676 Pain in unspecified toe(s): Secondary | ICD-10-CM

## 2018-10-09 ENCOUNTER — Other Ambulatory Visit: Payer: Self-pay

## 2018-10-09 ENCOUNTER — Encounter: Payer: Self-pay | Admitting: Podiatry

## 2018-10-09 ENCOUNTER — Ambulatory Visit (INDEPENDENT_AMBULATORY_CARE_PROVIDER_SITE_OTHER): Payer: 59 | Admitting: Podiatry

## 2018-10-09 DIAGNOSIS — R600 Localized edema: Secondary | ICD-10-CM

## 2018-10-09 DIAGNOSIS — S93401A Sprain of unspecified ligament of right ankle, initial encounter: Secondary | ICD-10-CM

## 2018-10-09 NOTE — Progress Notes (Signed)
Subjective:   Patient ID: [name], female   DOB: 53 y.o.   MRN: [id number]   HPI Patient states my ankle is feeling better and the swelling seems to be reducing but I can still get pain if I monitor along with still wearing boot   ROS      Objective:  Physical Exam  Neurovascular status intact negative Homans sign noted with patient's right ankle healing well minimal edema noted with good range of motion of the subtalar midtarsal joint currently with mild diminishment of motion ankle if she still is splinting     Assessment:  Edema which is reducing right still present with discomfort still noted and difficult to say how she will do if she gets into regular shoes     Plan:  We reviewed condition and at this point dispensed ankle compression stocking and instructed on range of motion offered physical therapy but she wants to hold off currently see how she does alone and if is not improved in the next 3 to 4 weeks and then see her back

## 2018-12-03 ENCOUNTER — Other Ambulatory Visit: Payer: Self-pay

## 2018-12-03 ENCOUNTER — Ambulatory Visit: Payer: 59 | Admitting: Podiatry

## 2018-12-03 ENCOUNTER — Other Ambulatory Visit: Payer: Self-pay | Admitting: Podiatry

## 2018-12-03 ENCOUNTER — Encounter: Payer: Self-pay | Admitting: Podiatry

## 2018-12-03 ENCOUNTER — Ambulatory Visit (INDEPENDENT_AMBULATORY_CARE_PROVIDER_SITE_OTHER): Payer: 59

## 2018-12-03 DIAGNOSIS — M1000 Idiopathic gout, unspecified site: Secondary | ICD-10-CM | POA: Diagnosis not present

## 2018-12-03 DIAGNOSIS — M79672 Pain in left foot: Secondary | ICD-10-CM

## 2018-12-03 DIAGNOSIS — M10072 Idiopathic gout, left ankle and foot: Secondary | ICD-10-CM

## 2018-12-03 DIAGNOSIS — M7662 Achilles tendinitis, left leg: Secondary | ICD-10-CM

## 2018-12-03 DIAGNOSIS — R600 Localized edema: Secondary | ICD-10-CM

## 2018-12-03 DIAGNOSIS — M7672 Peroneal tendinitis, left leg: Secondary | ICD-10-CM

## 2018-12-03 MED ORDER — METHYLPREDNISOLONE 4 MG PO TBPK: ORAL_TABLET | ORAL | 0 refills | Status: DC

## 2018-12-03 NOTE — Patient Instructions (Signed)

## 2018-12-03 NOTE — Progress Notes (Signed)
Subjective:   Patient ID: [name], female   DOB: 53 y.o.   MRN: [id number]   HPI Patient presents stating she started to develop a lot of swelling in her left foot outside and also has problems with the Achilles tendon left.  Does not remember specific injury and states been hurting for around a week with her right foot doing better   ROS      Objective:  Physical Exam  Neurovascular status intact with patient's left peroneal tendon inflamed on the outside of the foot and inflammation at the muscle tendon junction of the posterior heel with swelling noted in the midfoot and negative Homans' sign noted     Assessment:  2 separate problems with one being peroneal tendinitis (secondarily being Achilles tendon inflammation with possibility for systemic issue or graft and swelling     Plan:  H&P reviewed all conditions and today I did a careful injection of the outside of the left foot peroneal tendon group 3 mg Dexasone Kenalog 5 mg Xylocaine and applied Unna boot to reduce swelling along with continued cam walker usage and I am sending for blood work to rule out arthritic issue or gout.  Reappoint 1 week to recheck  X-rays were negative for signs of fracture or other pathology and I also went ahead and placed her on a Medrol Dosepak

## 2018-12-04 LAB — URIC ACID: Uric Acid, Serum: 11.2 mg/dL — ABNORMAL HIGH (ref 2.5–7.0)

## 2018-12-04 LAB — ANA, IFA COMPREHENSIVE PANEL
Anti Nuclear Antibody (ANA): NEGATIVE
ENA SM Ab Ser-aCnc: <1 AI
SM/RNP: <1 AI
SSA (Ro) (ENA) Antibody, IgG: <1 AI
SSB (La) (ENA) Antibody, IgG: <1 AI
Scleroderma (Scl-70) (ENA) Antibody, IgG: <1 AI
ds DNA Ab: <1 IU/mL

## 2018-12-04 LAB — RHEUMATOID FACTOR: Rheumatoid fact SerPl-aCnc: <14 IU/mL (ref <14)

## 2018-12-04 LAB — C-REACTIVE PROTEIN: CRP: 68.5 mg/L — ABNORMAL HIGH (ref <8.0)

## 2018-12-04 LAB — SEDIMENTATION RATE: Sed Rate: 34 mm/h — ABNORMAL HIGH (ref 0–30)

## 2018-12-05 ENCOUNTER — Other Ambulatory Visit: Payer: Self-pay | Admitting: Family Medicine

## 2018-12-05 DIAGNOSIS — Z1231 Encounter for screening mammogram for malignant neoplasm of breast: Secondary | ICD-10-CM

## 2018-12-10 ENCOUNTER — Other Ambulatory Visit: Payer: Self-pay

## 2018-12-10 ENCOUNTER — Encounter: Payer: Self-pay | Admitting: Podiatry

## 2018-12-10 ENCOUNTER — Ambulatory Visit (INDEPENDENT_AMBULATORY_CARE_PROVIDER_SITE_OTHER): Payer: 59 | Admitting: Podiatry

## 2018-12-10 DIAGNOSIS — M7662 Achilles tendinitis, left leg: Secondary | ICD-10-CM | POA: Diagnosis not present

## 2018-12-10 DIAGNOSIS — M1000 Idiopathic gout, unspecified site: Secondary | ICD-10-CM

## 2018-12-10 MED ORDER — COLCHICINE 0.6 MG PO TABS: 0.6000 mg | ORAL_TABLET | Freq: Two times a day (BID) | ORAL | 1 refills | Status: DC

## 2018-12-10 MED ORDER — ALLOPURINOL 100 MG PO TABS: 100.0000 mg | ORAL_TABLET | Freq: Every day | ORAL | 6 refills | Status: DC

## 2018-12-10 NOTE — Progress Notes (Signed)
Subjective:   Patient ID: [name], female   DOB: 53 y.o.   MRN: [id number]   HPI Patient states the pain is worse now in the Achilles tendon left and the top of the foot seems to be feeling some better and I wanted to review my blood work   ROS      Objective:  Physical Exam  Neurovascular status intact with discomfort more within the Achilles tendon within the substance of the tendon itself with no indication of tear and has mild calf pain but negative Bevelyn Buckles' sign was noted left with no swelling noted.  Top of the foot has improved      Assessment:  Appears to be acute Achilles tendinitis with gout certainly a precipitating factor due to blood work     Plan:  H&P reviewed blood work indicating quite an elevation of uric acid along with C-reactive protein and I placed her on allopurinol and colchicine.  I did discuss that if this were to continue we may need to get a Doppler of her leg left but at this point it does not seem necessary as it appears to be centered on the Achilles tendon and I did advise her on exercises and I want to see her back 6 weeks or earlier if needed but hopefully she will respond well to the 2 medications

## 2019-01-19 ENCOUNTER — Other Ambulatory Visit (HOSPITAL_COMMUNITY): Payer: Self-pay | Admitting: Surgery

## 2019-01-19 ENCOUNTER — Other Ambulatory Visit: Payer: Self-pay | Admitting: Surgery

## 2019-01-21 ENCOUNTER — Other Ambulatory Visit: Payer: Self-pay | Admitting: Podiatry

## 2019-01-21 ENCOUNTER — Ambulatory Visit: Payer: 59 | Admitting: Podiatry

## 2019-01-28 ENCOUNTER — Ambulatory Visit
Admission: RE | Admit: 2019-01-28 | Discharge: 2019-01-28 | Disposition: A | Discharge status: Home / Self Care | Payer: 59 | Source: Ambulatory Visit | Attending: Family Medicine | Admitting: Family Medicine

## 2019-01-28 ENCOUNTER — Other Ambulatory Visit: Payer: Self-pay

## 2019-01-28 DIAGNOSIS — Z1231 Encounter for screening mammogram for malignant neoplasm of breast: Secondary | ICD-10-CM

## 2019-01-29 ENCOUNTER — Ambulatory Visit (INDEPENDENT_AMBULATORY_CARE_PROVIDER_SITE_OTHER): Payer: 59 | Admitting: Neurology

## 2019-01-29 ENCOUNTER — Encounter: Payer: Self-pay | Admitting: Neurology

## 2019-01-29 VITALS — BP 140/89 | HR 88 | Temp 97.7°F | Ht 64.0 in | Wt 274.0 lb

## 2019-01-29 DIAGNOSIS — R0683 Snoring: Secondary | ICD-10-CM | POA: Diagnosis not present

## 2019-01-29 DIAGNOSIS — E559 Vitamin D deficiency, unspecified: Secondary | ICD-10-CM

## 2019-01-29 DIAGNOSIS — I10 Essential (primary) hypertension: Secondary | ICD-10-CM

## 2019-01-29 DIAGNOSIS — E662 Morbid (severe) obesity with alveolar hypoventilation: Secondary | ICD-10-CM

## 2019-01-29 DIAGNOSIS — R7303 Prediabetes: Secondary | ICD-10-CM

## 2019-01-29 NOTE — Progress Notes (Signed)
SLEEP MEDICINE CLINIC    Provider:  Larey Seat, MD  Primary Care Physician:  Lucianne Lei, [address] [place] [address]     Referring Provider:  Dr Lucia Gaskins at [facility]         Chief Complaint according to patient   Patient presents with:    . New Patient (Initial Visit)           HISTORY OF PRESENT ILLNESS:  [name] is a 54 y.o. year old African American female patient and seen here on 01/29/2019 upon referral by her bariatric surgeon. She is undergoing evaluation for possible OSA/ OHS before gastric bypass surgery.     Chief concern according to patient : [name] reports that she had her highest weight ever about 4 5 months ago when she weighed 295 pounds, today she is 274 pounds including.  Weight loss of approximately 20 to 25 pounds precedes her planned surgery and will reduce surgical risk.  She remains at a body mass index of 47, she has slightly elevated blood pressures, she has been told she snores but she has never been told that she stops breathing.  There has been no problems with previous sleep study.   I have the pleasure of seeing [name] today, a right -handed African American  female with  possible obesity hypoventilation.  She has a medical history of Anemia, Constipation, Gallbladder problem, History of acute pancreatitis, Hypertension, Left shoulder pain, and Low back pain, and continuous weight gain to morbid obesity since age 83.     Sleep relevant medical history: Nocturia 1-2 , there has been no Tonsillectomy, no ENT procedure, and no  cervical spine or TB injuries.    Family medical /sleep history: no other family member on CPAP with OSA, insomnia, and no sleep walking history.    Social history:  Patient is working as an Scientist, forensic, and lives in a household  alone.  Family status is divorced, without children.  The patient currently works from her office. Pets are not  present. Tobacco use; quit 1999 after 12 years.  ETOH use ;very  rarely ,  Caffeine intake in form of Coffee( 1 cup ) Soda( none ) Tea ( when eating out) , nor energy drinks. Regular exercise in form of walking . Hobbies: none   She lives very isolated, had no vaccine, has not contracted Covid.    Sleep habits are as follows: The patient's dinner time is between 6-8 PM. The patient goes to bed at 10-11 PM in her bedroom, after she watched TV in the living room before. She continues to sleep for 2-3  hours, wakes for two bathroom breaks, the first time at 4 AM and again at 6 AM.   The preferred sleep position is laterally , with the support of 2 pillows.  Dreams are reportedly infrequent.Marland Kitchen  7 AM is the usual rise time. The patient wakes up spontaneously with an alarm, set at 7.15 and 7.30 again..   She reports not feeling refreshed or restored in AM, with symptoms such as dry mouth, morning headaches, and these improve after 1-2 hours up and going. She has sinus pressure. She feels  residual fatigue.  Naps are taken infrequently.    Review of Systems: Out of a complete 14 system review, the patient complains of only the following symptoms, and all other reviewed systems are negative.:  Fatigue, sleepiness , snoring, fragmented sleep by nocturia.  How likely are you to doze in the following situations: 0 = not likely, 1 = slight chance, 2 = moderate chance, 3 = high chance   Sitting and Reading? Watching Television? Sitting inactive in a public place (theater or meeting)? As a passenger in a car for an hour without a break? Lying down in the afternoon when circumstances permit? Sitting and talking to someone? Sitting quietly after lunch without alcohol? In a car, while stopped for a few minutes in traffic?   Total = 5/ 24 points   FSS endorsed at 24/ 63 points.   Social History   Socioeconomic History  . Marital status: Divorced    Spouse name: Not on file  . Number of children:  Not on file  . Years of education: Not on file  . Highest education level: Not on file  Occupational History  . Occupation: Glass blower/designer: [employer]  Tobacco Use  . Smoking status: Former Smoker    Quit date: 01/01/1997    Years since quitting: 22.0  . Smokeless tobacco: Never Used  Substance and Sexual Activity  . Alcohol use: Yes    Comment: occ  . Drug use: Not on file  . Sexual activity: Not on file  Other Topics Concern  . Not on file  Social History Narrative  . Not on file   Social Determinants of Health   Financial Resource Strain:   . Difficulty of Paying Living Expenses: Not on file  Food Insecurity:   . Worried About Charity fundraiser in the Last Year: Not on file  . Ran Out of Food in the Last Year: Not on file  Transportation Needs:   . Lack of Transportation (Medical): Not on file  . Lack of Transportation (Non-Medical): Not on file  Physical Activity:   . Days of Exercise per Week: Not on file  . Minutes of Exercise per Session: Not on file  Stress:   . Feeling of Stress : Not on file  Social Connections:   . Frequency of Communication with Friends and Family: Not on file  . Frequency of Social Gatherings with Friends and Family: Not on file  . Attends Religious Services: Not on file  . Active Member of Clubs or Organizations: Not on file  . Attends Archivist Meetings: Not on file  . Marital Status: Not on file    Family History  Problem Relation Age of Onset  . Hypertension Mother   . Hypertension Father   . Cancer Other     Past Medical History:  Diagnosis Date  . Anemia   . Constipation   . Gallbladder problem   . History of acute pancreatitis   . Hypertension   . Left shoulder pain   . Low back pain     Past Surgical History:  Procedure Laterality Date  . ABDOMINAL HYSTERECTOMY    . ABDOMINAL HYSTERECTOMY    . CHOLECYSTECTOMY    . PANCREAS SURGERY       Current Outpatient  Medications on File Prior to Visit  Medication Sig Dispense Refill  . allopurinol (ZYLOPRIM) 100 MG tablet Take 1 tablet (100 mg total) by mouth daily. 30 tablet 6  . amLODipine (NORVASC) 10 MG tablet Take 10 mg by mouth at bedtime.    . Ascorbic Acid (VITAMIN C PO) Take by mouth.    . Azilsartan-Chlorthalidone (EDARBYCLOR PO) Take by mouth.    . Calcium 500-100 MG-UNIT CHEW Chew 1  tablet by mouth 2 (two) times daily.    . colchicine 0.6 MG tablet Take 1 tablet by mouth twice daily 30 tablet 0  . indapamide (LOZOL) 1.25 MG tablet Take 1.25 mg by mouth every morning.    . Multiple Vitamin (MULTIVITAMIN WITH MINERALS) TABS tablet Take 1 tablet by mouth daily.    Marland Kitchen OZEMPIC, 1 MG/DOSE, 2 MG/1.5ML SOPN INJECT 1MG  ONCE A WEEK    . Vitamin D, Ergocalciferol, (DRISDOL) 50000 units CAPS capsule Take 1 capsule (50,000 Units total) by mouth every 7 (seven) days. 4 capsule 0   No current facility-administered medications on file prior to visit.    Allergies  Allergen Reactions  . Contrast Media [Iodinated Diagnostic Agents] Nausea And Vomiting    Pt began vomiting after multihance injection     Physical exam:  Today's Vitals   01/29/19 0959  BP: 140/89  Pulse: 88  Temp: 97.7 F (36.5 C)  Weight: 274 lb (124.3 kg)  Height: 5\' 4"  (1.626 m)   Body mass index is 47.03 kg/m.   Wt Readings from Last 3 Encounters:  01/29/19 274 lb (124.3 kg)  09/11/17 258 lb (117 kg)  08/26/17 259 lb (117.5 kg)     Ht Readings from Last 3 Encounters:  01/29/19 5\' 4"  (1.626 m)  09/11/17 5\' 3"  (1.6 m)  08/26/17 5\' 3"  (1.6 m)      General: The patient is awake, alert and appears not in acute distress. The patient is well groomed. Head: Normocephalic, atraumatic.  Neck is supple. Mallampati: 4  neck circumference: 17 inches.  Nasal airflow is patent .  Retrognathia is mild .  Dental status: intact  Cardiovascular:  Regular rate and cardiac rhythm by pulse,  without distended neck veins. Respiratory:  Lungs are clear to auscultation.  Skin:  Without evidence of ankle edema, or rash. Trunk: The patient's posture is erect.   Neurologic exam : The patient is awake and alert, oriented to place and time.   Memory subjective described as intact.  Attention span & concentration ability appears normal.  Speech is fluent,  without  dysarthria, dysphonia or aphasia.  Mood and affect are appropriate.   Cranial nerves: no loss of smell or taste reported. Pupils are equal and briskly reactive to light. Funduscopic exam deferred.  Extraocular movements in vertical and horizontal planes were intact and without nystagmus.  No Diplopia. Visual fields by finger perimetry are intact. Hearing was intact to soft voice and finger rubbing. Facial sensation intact to fine touch. Facial motor strength is symmetric and tongue and uvula move midline.   Neck ROM : rotation, tilt and flexion extension were normal for age and shoulder shrug was symmetrical.    Motor exam:  Symmetric bulk, tone and ROM.   she had an ankle sprain, her left achilles tendon is inflamed and she wears a boot.  Normal tone without cog wheeling, symmetric grip strength . Sensory:  Fine touch, pinprick and vibration were tested  and  normal.  Proprioception tested in the upper extremities was normal.  Coordination: Rapid alternating movements in the fingers/hands were of normal speed.  The Finger-to-nose maneuver was intact without evidence of ataxia, dysmetria or tremor. Gait and station: Patient could rise unassisted from a seated position, walked without assistive device.  Stance is of normal width/ base and the patient turned with 4 steps, in her heel boot..  Toe and heel walk were deferred.  Deep tendon reflexes: in the  upper and lower extremities are symmetric  and brisk.  Babinski response was deferred.      Dear Dr. Criss Rosales Dear Dr. Lucia Gaskins Dear Dr. Arelia Longest.  I have the pleasure of seeing [name] today who has been  preparing for a possible Roux-en-Y gastric bypass surgery.  She has struggled with weight for much of her adult life, she is full-time gainfully employed, she is eager to lose weight and become more exercise tolerance and overall healthier.  She presents without any sign of distress, her voice has been slightly hoarse but there is no shortness of breath, and she presented today in an ankle boot.  In spite of limited mobility she was able to lose about 20 pounds since being in preparation for weight loss surgery.    In short, [name] is presenting with non restorative sleep, dry mouth and headches, a symptom that can be attributed to obstructive sleep apnea and she may well have also obesity hypoventilation based on her report of snoring which her nephew that sometimes spends the night at her home has confirmed, she also regularly wakes up with a morning headache which is very often associated for CO2 the CO2 retention and hypoxemia during sleep.    After spending a total time of 45 minutes face to face and additional time for physical and neurologic examination, review of laboratory studies,  personal review of imaging studies, reports and results of other testing and review of referral information / records as far as provided in visit, I have established the following assessments:    My Plan is to proceed with:  1My goal is to evaluate her for these conditions and I would like for her to have a home sleep test as permitted by [place] healthcare, should there be an indication for central apnea or significant and severe hypoxemia she may have to return for an attended sleep study at a later point.   I would like to thank Dr Lucia Gaskins, MD  for allowing me to meet with and to take care of this pleasant patient.  I plan to follow up either personally or through our NP within 2-3 month.     Electronically signed by: Larey Seat, MD 01/29/2019 10:14 AM  [facility] and  [employer] Board certified by The [employer] of Sleep Medicine and Diplomate of the [employer] of Sleep Medicine. Board certified In Neurology through the [place], Fellow of the [employer] of Neurology. Medical Director of [employer].

## 2019-01-29 NOTE — Patient Instructions (Signed)
Obesity Hypoventilation Syndrome  Obesity hypoventilation syndrome (OHS) means that you are not breathing well enough to get air in and out of your lungs efficiently (ventilation). This causes a low oxygen level and a high carbon dioxide level in your blood (hypoventilation). Having too much total body fat (obesity) is a significant risk factor for developing OHS. OHS makes it harder for your heart to pump oxygen-rich blood to your body. It can cause sleep disturbances and make you feel sleepy during the day. Over time, OHS can increase your risk for:  Heart disease.  High blood pressure (hypertension).  Reduced ability to absorb sugar from the bloodstream (insulin resistance).  Heart failure. Over time, OHS weakens your heart and can lead to heart failure. What are the causes? The exact cause of OHS is not known. Possible causes include:  Pressure on the lungs from excess body weight.  Obesity-related changes in how much air the lungs can hold (lung capacity) and how much they can expand (lung compliance).  Failure of the brain to regulate oxygen and carbon dioxide levels properly.  Chemicals (hormones) produced by excess fat cells interfering with breathing regulation.  A breathing condition in which breathing pauses or becomes shallow during sleep (sleep apnea). This condition can eventually cause the body to ventilate poorly and to hold onto carbon dioxide during the day. What increases the risk? You may have a greater risk for OHS if you:  Have a BMI of 30 or higher. BMI is an estimate of body fat that is calculated from height and weight. For adults, a BMI of 30 or higher is considered obese.  Are 40?54 years old.  Carry most of your excess weight around your waist.  Experience moderate symptoms of sleep apnea. What are the signs or symptoms? The most common symptoms of OHS are:  Daytime sleepiness.  Lack of energy.  Shortness of breath.  Morning headaches.  Sleep  apnea.  Trouble concentrating.  Irritability, mood swings, or depression.  Swollen veins in the neck.  Swelling of the legs. How is this diagnosed? Your health care provider may suspect OHS if you are obese and have poor breathing during the day and at night. Your health care provider will also do a physical exam. You may have tests to:  Measure your BMI.  Measure your blood oxygen level with a sensor placed on your finger (pulse oximetry).  Measure blood oxygen and carbon dioxide in a blood sample.  Measure the amount of red blood cells in a blood sample. OHS causes the number of red blood cells you have to increase (polycythemia).  Check your breathing ability (pulmonary function testing).  Check your breathing ability, breathing patterns, and oxygen level while you sleep (sleep study). You may also have a chest X-ray to rule out other breathing problems. You may have an electrocardiogram (ECG) and or echocardiogram to check for signs of heart failure. How is this treated? Weight loss is the most important part of treatment for OHS, and it may be the only treatment that you need. Other treatments may include:  Using a device to open your airway while you sleep, such as a continuous positive airway pressure (CPAP) machine that delivers oxygen to your airway through a mask.  Surgery (gastric bypass surgery) to lower your BMI. This may be needed if: ? You are very obese. ? Other treatments have not worked for you. ? Your OHS is very severe and is causing organ damage, such as heart failure. Follow these   instructions at home:  Medicines  Take over-the-counter and prescription medicines only as told by your health care provider.  Ask your health care provider what medicines are safe for you. You may be told to avoid medicines that can impair breathing and make OHS worse, such as sedatives and narcotics. Sleeping habits  If you are prescribed a CPAP machine, make sure you  understand and use the machine as directed.  Try to get 8 hours of sleep every night.  Go to bed at the same time every night, and get up at the same time every day. General instructions  Work with your health care provider to make a diet and exercise plan that helps you reach and maintain a healthy weight.  Eat a healthy diet.  Avoid smoking.  Exercise regularly as told by your health care provider.  During the evening, do not drink caffeine and do not eat heavy meals.  Keep all follow-up visits as told by your health care provider. This is important. Contact a health care provider if:  You experience new or worsening shortness of breath.  You have chest pain.  You have an irregular heartbeat (palpitations).  You have dizziness.  You faint.  You develop a cough.  You have a fever.  You have chest pain when you breathe (pleurisy). This information is not intended to replace advice given to you by your health care provider. Make sure you discuss any questions you have with your health care provider. Document Revised: 04/11/2018 Document Reviewed: 05/30/2015 Elsevier Patient Education  2020 Elsevier Inc. Screening for Sleep Apnea  Sleep apnea is a condition in which breathing pauses or becomes shallow during sleep. Sleep apnea screening is a test to determine if you are at risk for sleep apnea. The test is easy and only takes a few minutes. Your health care provider may ask you to have this test in preparation for surgery or as part of a physical exam. What are the symptoms of sleep apnea? Common symptoms of sleep apnea include:  Snoring.  Restless sleep.  Daytime sleepiness.  Pauses in breathing.  Choking during sleep.  Irritability.  Forgetfulness.  Trouble thinking clearly.  Depression.  Personality changes. Most people with sleep apnea are not aware that they have it. Why should I get screened? Getting screened for sleep apnea can help:  Ensure  your safety. It is important for your health care providers to know whether or not you have sleep apnea, especially if you are having surgery or have other long-term (chronic) health conditions.  Improve your health and allow you to get a better night's rest. Restful sleep can help you: ? Have more energy. ? Lose weight. ? Improve high blood pressure. ? Improve diabetes management. ? Prevent stroke. ? Prevent car accidents. How is screening done? Screening usually includes being asked a list of questions about your sleep quality. Some questions you may be asked include:  Do you snore?  Is your sleep restless?  Do you have daytime sleepiness?  Has a partner or spouse told you that you stop breathing during sleep?  Have you had trouble concentrating or memory loss? If your screening test is positive, you are at risk for the condition. Further testing may be needed to confirm a diagnosis of sleep apnea. Where to find more information You can find screening tools online or at your health care clinic. For more information about sleep apnea screening and healthy sleep, visit these websites:  Centers for Disease Control and   Prevention: www.cdc.gov/sleep/index.html  American Sleep Apnea Association: www.sleepapnea.org Contact a health care provider if:  You think that you may have sleep apnea. Summary  Sleep apnea screening can help determine if you are at risk for sleep apnea.  It is important for your health care providers to know whether or not you have sleep apnea, especially if you are having surgery or have other chronic health conditions.  You may be asked to take a screening test for sleep apnea in preparation for surgery or as part of a physical exam. This information is not intended to replace advice given to you by your health care provider. Make sure you discuss any questions you have with your health care provider. Document Revised: 10/04/2017 Document Reviewed:  03/30/2016 Elsevier Patient Education  2020 Elsevier Inc.  

## 2019-01-30 ENCOUNTER — Ambulatory Visit (HOSPITAL_COMMUNITY)
Admission: RE | Admit: 2019-01-30 | Discharge: 2019-01-30 | Disposition: A | Discharge status: Home / Self Care | Payer: 59 | Source: Ambulatory Visit | Attending: Surgery | Admitting: Surgery

## 2019-01-30 ENCOUNTER — Other Ambulatory Visit: Payer: Self-pay

## 2019-01-30 ENCOUNTER — Other Ambulatory Visit (HOSPITAL_COMMUNITY): Payer: Self-pay | Admitting: Surgery

## 2019-02-10 ENCOUNTER — Other Ambulatory Visit: Payer: Self-pay

## 2019-02-10 ENCOUNTER — Encounter: Payer: Self-pay | Admitting: Skilled Nursing Facility1

## 2019-02-10 ENCOUNTER — Encounter: Payer: 59 | Attending: Surgery | Admitting: Skilled Nursing Facility1

## 2019-02-10 DIAGNOSIS — E669 Obesity, unspecified: Secondary | ICD-10-CM | POA: Diagnosis present

## 2019-02-10 NOTE — Progress Notes (Signed)
Nutrition Assessment for Bariatric Surgery Medical Nutrition Therapy Appt Start Time: 11:05 End Time:  12:06  Patient was seen on 02/10/2019 for Pre-Operative Nutrition Assessment. Letter of approval faxed to [facility] bariatric surgery program coordinator on 02/10/2019  Referral stated Supervised Weight Loss (SWL) visits needed: 0  Planned surgery: RYGB Pt expectation of surgery: to lose wieght Pt expectation of dietitian: not mentioned     NUTRITION ASSESSMENT   Anthropometrics  Start weight at [facility]: 277.7 lbs (date: 02/10/2019)  Height: 64 in BMI: 47.67 kg/m2     Clinical  Medical hx: prediabetes Medications:  Labs:  Notable signs/symptoms:   Lifestyle & Dietary Hx  Pt states if she lays down on food she will get chest pressure (may be silent reflux).  24-Hr Dietary Recall First Meal: skipped Snack:  Second Meal: eggs and beyond meat sausage and toast Snack: cookies Third Meal: salad with eggplant parm Snack: popcorn Beverages: coffee with creamer, gingerale, water, sweet tea   Estimated Energy Needs Calories: 1500 Carbohydrate: 170g Protein: 112g Fat: 42g   NUTRITION DIAGNOSIS  Overweight/obesity ([place]-3.3) related to past poor dietary habits and physical inactivity as evidenced by patient w/ planned RYGB surgery following dietary guidelines for continued weight loss.    NUTRITION INTERVENTION  Nutrition counseling (C-1) and education (E-2) to facilitate bariatric surgery goals.   Pre-Op Goals Reviewed with the Patient . Track food and beverage intake (pen and paper, MyFitness Pal, Baritastic app, etc.) . Make healthy food choices while monitoring portion sizes . Consume 3 meals per day or try to eat every 3-5 hours . Avoid concentrated sugars and fried foods . Keep sugar & fat in the single digits per serving on food labels . Practice CHEWING your food (aim for applesauce consistency) . Practice not drinking 15 minutes before, during, and  30 minutes after each meal and snack . Avoid all carbonated beverages (ex: soda, sparkling beverages)  . Limit caffeinated beverages (ex: coffee, tea, energy drinks) . Avoid all sugar-sweetened beverages (ex: regular soda, sports drinks)  . Avoid alcohol  . Aim for 64-100 ounces of FLUID daily (with at least half of fluid intake being plain water)  . Aim for at least 60-80 grams of PROTEIN daily . Look for a liquid protein source that contains ?15 g protein and ?5 g carbohydrate (ex: shakes, drinks, shots) . Make a list of non-food related activities . Physical activity is an important part of a healthy lifestyle so keep it moving! The goal is to reach 150 minutes of exercise per week, including cardiovascular and weight baring activity.  *Goals that are bolded indicate the pt would like to start working towards these  Handouts Provided Include  . Bariatric Surgery handouts (Nutrition Visits, Pre-Op Goals, Protein Shakes, Vitamins & Minerals)  Learning Style & Readiness for Change Teaching method utilized: Visual & Auditory  Demonstrated degree of understanding via: Teach Back  Barriers to learning/adherence to lifestyle change: none stated     MONITORING & EVALUATION Dietary intake, weekly physical activity, body weight, and pre-op goals reached at next nutrition visit.    Next Steps  Patient is to follow up at [facility] for Pre-Op Class >2 weeks before surgery for further nutrition education.

## 2019-02-11 ENCOUNTER — Other Ambulatory Visit: Payer: Self-pay

## 2019-02-11 ENCOUNTER — Ambulatory Visit (INDEPENDENT_AMBULATORY_CARE_PROVIDER_SITE_OTHER): Payer: 59 | Admitting: Neurology

## 2019-02-11 DIAGNOSIS — I10 Essential (primary) hypertension: Secondary | ICD-10-CM

## 2019-02-11 DIAGNOSIS — R7303 Prediabetes: Secondary | ICD-10-CM

## 2019-02-11 DIAGNOSIS — G4733 Obstructive sleep apnea (adult) (pediatric): Secondary | ICD-10-CM

## 2019-02-11 DIAGNOSIS — E559 Vitamin D deficiency, unspecified: Secondary | ICD-10-CM

## 2019-02-11 DIAGNOSIS — E662 Morbid (severe) obesity with alveolar hypoventilation: Secondary | ICD-10-CM

## 2019-02-23 ENCOUNTER — Other Ambulatory Visit: Payer: Self-pay | Admitting: Podiatry

## 2019-02-24 NOTE — Procedures (Signed)
Patient Information     First Name: Michelle Last Name: Galloway ID: [id number]  Birth Date: 1965/08/26 Age: 54 Gender: Female  Referring Provider: Dr. Lucia Gaskins, [facility] Surgery BMI: 46.7 (W=273 lb, H=5' 4'')  Neck Circ.:  17 '' Epworth:  5/24   Sleep Study Information    Study Date: Feb 11, 2019 S/H/A Version: 001.001.001.001 / 4.1.1528 / 77  History:     [name] is a 54 year old African- American female patient and seen on 01/29/2019 upon referral by her bariatric surgeon.  She is undergoing evaluation for possible OSA/ OHS before gastric bypass surgery.  [name] reports that she reahed her highest weight ever about August 2020 when she weighed 295 pounds, today she is 274 pounds including clothes.  Weight loss of approximately 20 to 25 pounds precedes her planned surgery and will reduce surgical risk. She remains at a body mass index of 54 kg/m2, she has slightly elevated blood pressures, she has been told she snores but she has never been told that she stops breathing.  [name] is a right -handed African American female at risk for obesity hypoventilation.  She has a medical history of Anemia, Constipation, Gallbladder problem, History of acute pancreatitis, Hypertension, Left shoulder pain, and Low back pain, and continuous weight gain to morbid obesity since age 18.      Summary & Diagnosis:    Severest Obstructive Sleep apnea - AHI of 53.2/h and REM AHI of 79.5/h. severe hypoxia in REM sleep with nadir at 72% SpO2, and total desaturation time of 20 minutes. Thunderous snoring recorded.  Extremely variable heart rate may have been affected by movement artefact.    Recommendations:      Immediate treatment with positive airway pressure is indicated, I will prescribe auto CPAP device with a setting of 5-18 cm water, 2 cm EP and mask of patient's choice, and with heated humidity.   Interpreting Physician:  [name], [name]             Sleep Summary   Oxygen Saturation Statistics   Start Study Time: End Study Time: Total Recording Time:       10:13:41 PM 7:55:27 AM 9 h, 41 min  Total Sleep Time % REM of Sleep Time:  8 h, 45 min  28.2    Mean: 91 Minimum: 53 Maximum: 97  Mean of Desaturations Nadirs (%):   86  Oxygen Desaturation. %:   4-9 10-20 >20 Total  Events Number Total    98  16 82.4 13.4  5 4.2  119 100.0  Oxygen Saturation: <90 <=88 <85 <80 <70  Duration (minutes): Sleep % 19.8 3.8 13.3 4.4 2.5 0.8 3.3 0.6 2.3 0.4     Respiratory Indices      Total Events REM NREM All Night  pRDI:  203  pAHI:  201 ODI:  119  pAHIc:  6  % CSR: 0.0 79.5 79.5 70.3 6.1 51.3 50.7 27.8 1.2 53.7 53.2 31.5 1.6       Pulse Rate Statistics during Sleep (BPM)      Mean: 75 Minimum: N/A Maximum: 118    Indices are calculated using technically valid sleep time of  6 h, 46 min. Central-Indices are calculated using technically valid sleep time of  6  hrs, 42 min. pRDI/pAHI are calculated using oxi desaturations ? 3%  Body Position Statistics  Position Supine Prone Right Left Non-Supine  Sleep (min) 330.0 89.5 60.8 45.0 195.3  Sleep % 62.8  17.0 11.6 8.6 37.2  pRDI 49.8 38.2 98.6 61.8 58.0  pAHI 49.3 37.3 98.6 61.8 57.4  ODI  Patient Information     First Name: Michelle Last Name: Tesa Galloway: [id number]  Birth Date: [birth year] Age: 54 Gender: Female  Referring Provider: Dr. Lucia Gaskins, [facility] Surgery BMI: 46.7 (W=273 lb, H=5' 4'')  Neck Circ.:  17 '' Epworth:  5/24   Sleep Study Information    Study Date: Feb 11, 2019 S/H/A Version: 001.001.001.001 / 4.1.1528 / 77  History:     [name] is a 54 year old African- American female patient and seen on 01/29/2019 upon referral by her bariatric surgeon.  She is undergoing evaluation for possible OSA/ OHS before gastric bypass surgery.  [name] reports that she reahed her highest weight ever about August 2020 when she weighed 295 pounds, today she is 274 pounds  including clothes.  Weight loss of approximately 20 to 25 pounds precedes her planned surgery and will reduce surgical risk. She remains at a body mass index of 54 kg/m2, she has slightly elevated blood pressures, she has been told she snores but she has never been told that she stops breathing.  [name] is a right -handed African American female at risk for obesity hypoventilation.  She has a medical history of Anemia, Constipation, Gallbladder problem, History of acute pancreatitis, Hypertension, Left shoulder pain, and Low back pain, and continuous weight gain to morbid obesity since age 54.      Summary & Diagnosis:    Severest Obstructive Sleep apnea - AHI of 53.2/h and REM AHI of 79.5/h. severe hypoxia in REM sleep with nadir at 72% SpO2, and total desaturation time of 20 minutes. Thunderous snoring recorded.  Extremely variable heart rate may have been affected by movement artefact.    Recommendations:      Immediate treatment with positive airway pressure is indicated, I will prescribe auto CPAP device with a setting of 5-18 cm water, 2 cm EP and mask of patient's choice, and with heated humidity.   Interpreting Physician:  [name], [name]             Sleep Summary  Oxygen Saturation Statistics   Start Study Time: End Study Time: Total Recording Time:       10:13:41 PM 7:55:27 AM 9 h, 41 min  Total Sleep Time % REM of Sleep Time:  8 h, 45 min  28.2    Mean: 91 Minimum: 53 Maximum: 97  Mean of Desaturations Nadirs (%):   86  Oxygen Desaturation. %:   4-9 10-20 >20 Total  Events Number Total    98  16 82.4 13.4  5 4.2  119 100.0  Oxygen Saturation: <90 <=88 <85 <80 <70  Duration (minutes): Sleep % 19.8 3.8 13.3 4.4 2.5 0.8 3.3 0.6 2.3 0.4     Respiratory Indices      Total Events REM NREM All Night  pRDI:  203  pAHI:  201 ODI:  119  pAHIc:  6  % CSR: 0.0 79.5 79.5 70.3 6.1 51.3 50.7 27.8 1.2 53.7 53.2 31.5 1.6       Pulse Rate  Statistics during Sleep (BPM)      Mean: 75 Minimum: N/A Maximum: 118    Indices are calculated using technically valid sleep time of  6 h, 46 min. Central-Indices are calculated using technically valid sleep time of  6  hrs, 42 min. pRDI/pAHI are calculated using oxi desaturations ? 3%  Body Position Statistics  Position Supine Prone Right Left Non-Supine  Sleep (min) 330.0 89.5 60.8 45.0 195.3  Sleep % 62.8 17.0 11.6 8.6 37.2  pRDI 49.8 38.2 98.6 61.8 58.0  pAHI 49.3 37.3 98.6 61.8 57.4  ODI 25.7 17.7 77.1 45.5 37.9     Snoring Statistics Snoring Level (dB) >40 >50 >60 >70 >80 >Threshold (45)  Sleep (min) 386.6 79.7 9.2 4.8 0.0 134.7  Sleep % 73.6 15.2 1.7 0.9 0.0 25.6    Mean: 44 dB 25.7 17.7 77.1 45.5 37.9     Snoring Statistics Snoring Level (dB) >40 >50 >60 >70 >80 >Threshold (45)  Sleep (min) 386.6 79.7 9.2 4.8 0.0 134.7  Sleep % 73.6 15.2 1.7 0.9 0.0 25.6    Mean: 44 dB

## 2019-02-24 NOTE — Addendum Note (Signed)
Addended by: Larey Seat on: 02/24/2019 05:00 PM   Modules accepted: Orders

## 2019-02-24 NOTE — Progress Notes (Signed)
Summary & Diagnosis:   Severest Obstructive Sleep apnea - AHI of 53.2/h and REM AHI of  79.5/h. severe hypoxia in REM sleep with nadir at 72% SpO2, and  total desaturation time of 20 minutes. Thunderous snoring  recorded.  Extremely variable heart rate may have been affected by movement  artefact.    Recommendations:    Immediate treatment with positive airway pressure is indicated,  I will prescribe auto CPAP device with a setting of 5-18 cm  water, 2 cm EP and mask of patient's choice, and with heated  humidity.   Interpreting Physician: Larey Seat, MD

## 2019-02-25 ENCOUNTER — Telehealth: Payer: Self-pay | Admitting: Neurology

## 2019-02-25 NOTE — Telephone Encounter (Signed)
Called patient to discuss sleep study results. No answer at this time. LVM for the patient to call back.   

## 2019-02-25 NOTE — Telephone Encounter (Signed)
I called pt. I advised pt that Dr. Brett Fairy reviewed their sleep study results and found that pt has severe sleep apnea. Dr. Brett Fairy recommends that pt starts auto CPAP. I reviewed PAP compliance expectations with the pt. Pt is agreeable to starting a CPAP. I advised pt that an order will be sent to a DME, [facility], and [facility] will call the pt within about one week after they file with the pt's insurance. [facility] will show the pt how to use the machine, fit for masks, and troubleshoot the CPAP if needed. A follow up appt was made for insurance purposes with Ward Givens, NP on April 28,2021 at 10:30 am. Pt verbalized understanding to arrive 15 minutes early and bring their CPAP. A letter with all of this information in it will be mailed to the pt as a reminder. I verified with the pt that the address we have on file is correct. Pt verbalized understanding of results. Pt had no questions at this time but was encouraged to call back if questions arise. I have sent the order to [facility] and have received confirmation that they have received the order.

## 2019-02-25 NOTE — Telephone Encounter (Signed)
Pt returned call. Please call back when available. 

## 2019-02-25 NOTE — Telephone Encounter (Signed)
-----   Message from Larey Seat, MD sent at 02/24/2019  4:59 PM EST ----- Summary & Diagnosis:   Severest Obstructive Sleep apnea - AHI of 53.2/h and REM AHI of  79.5/h. severe hypoxia in REM sleep with nadir at 72% SpO2, and  total desaturation time of 20 minutes. Thunderous snoring  recorded.  Extremely variable heart rate may have been affected by movement  artefact.    Recommendations:    Immediate treatment with positive airway pressure is indicated,  I will prescribe auto CPAP device with a setting of 5-18 cm  water, 2 cm EP and mask of patient's choice, and with heated  humidity.   Interpreting Physician: Larey Seat, MD

## 2019-03-10 ENCOUNTER — Encounter: Payer: 59 | Attending: Surgery | Admitting: Dietician

## 2019-03-10 ENCOUNTER — Other Ambulatory Visit: Payer: Self-pay

## 2019-03-10 ENCOUNTER — Encounter: Payer: Self-pay | Admitting: Dietician

## 2019-03-10 DIAGNOSIS — E669 Obesity, unspecified: Secondary | ICD-10-CM | POA: Diagnosis present

## 2019-03-10 NOTE — Progress Notes (Signed)
Supervised Weight Loss Visit Bariatric Nutrition Education Appt Start Time: 10:15am     End Time: 10:40am  Planned Surgery: RYGB   1st out of 6 SWL Appointments    NUTRITION ASSESSMENT  Anthropometrics  Start weight at [facility]: 277.7 lbs (date: 02/10/2019) Today's weight: 276.3 lbs BMI: 47.4 kg/m2    Lifestyle & Dietary Hx Patient states she has been working on incorporating protein shakes to find which brands/flavors she likes. Previously did not eat breakfast (did not eat until about noon) but now will have a yogurt or protein shake in the morning. Previously practiced intermittent fasting. States that she typically does not snack throughout the day because she has not purchased any snacks to have in the house. Likes potatoes and pasta. States she would like to lose some weight prior to surgery per surgeon request.   Estimated daily fluid intake: 64 oz Supplements: unknown Current average weekly physical activity: unknown   24-Hr Dietary Recall First Meal: protein shake Snack: - Second Meal: Lean Cuisine + PB&J  Snack: -  Third Meal: chicken + greens + corn Snack: - Beverages: water, coffee w/ flavored creamer, ginger ale (not daily)   Estimated Energy Needs Calories: 1500 Carbohydrate: 170g Protein: 112g Fat: 42g   NUTRITION DIAGNOSIS  Overweight/obesity ([place]-3.3) related to past poor dietary habits and physical inactivity as evidenced by patient w/ planned RYGB surgery following dietary guidelines for continued weight loss.   NUTRITION INTERVENTION  Nutrition counseling (C-1) and education (E-2) to facilitate bariatric surgery goals.  Pre-Op Goals Progress & New Goals . Found a variety of protein shakes  . Eating breakfast and overall eating at least 3 times per day . NEW: Identify appropriate snacks to incorporate as needed . NEW: Monitor portion sizes and focus on small/frequent meals/snacks throughout the day   Handouts Provided Include   Bariatric Snack Ideas    Learning Style & Readiness for Change Teaching method utilized: Visual & Auditory  Demonstrated degree of understanding via: Teach Back  Barriers to learning/adherence to lifestyle change: None Identified    MONITORING & EVALUATION Dietary intake, weekly physical activity, body weight, and pre-op goals in 1 month.   Next Steps  Patient is to return to [facility] in 1 month for 2nd SWL.

## 2019-03-10 NOTE — Patient Instructions (Signed)
   Continue to eat/drink something for breakfast every day, great job with this! Continue finding a variety of the protein shakes that you like.   Aim to eat multiple meals/snacks throughout the day. Use the Snack Ideas sheet as needed. Always monitor your portion sizes and try your best to eat when hungry and stop when you are full.

## 2019-03-25 ENCOUNTER — Other Ambulatory Visit: Payer: Self-pay | Admitting: Podiatry

## 2019-04-07 ENCOUNTER — Other Ambulatory Visit: Payer: Self-pay

## 2019-04-07 ENCOUNTER — Encounter: Payer: 59 | Attending: Surgery | Admitting: Dietician

## 2019-04-07 ENCOUNTER — Encounter: Payer: Self-pay | Admitting: Dietician

## 2019-04-07 DIAGNOSIS — E669 Obesity, unspecified: Secondary | ICD-10-CM | POA: Insufficient documentation

## 2019-04-07 NOTE — Patient Instructions (Signed)
   Track everything you eat and drink on at least 1 day each week until your next visit.   Cut back on fried foods as much as possible... including fast foods such as fried chicken, fish, and french fries.   Aim to drink at least 4 bottles (64 ounces) of water daily.

## 2019-04-07 NOTE — Progress Notes (Signed)
Supervised Weight Loss Visit Bariatric Nutrition Education  Planned Surgery: RYGB   2nd out of 6 SWL Appointments    NUTRITION ASSESSMENT  Anthropometrics  Start weight at [facility]: 277.7 lbs (date: 02/10/2019) Today's weight: 278.2 lbs BMI: 47.8 kg/m2    Lifestyle & Dietary Hx Patient states she is still trying protein shake flavors to find various kinds she likes. Will have a yogurt or protein shake in the morning. Looking at nutrition facts labels for sugars to be less than 10 grams of sugar per serving, and keeping calories low (states she tries to stay within 1500-1700 kcals/day.) [employer] Delite, will have chicken kabob platter with double salad for side about once per week. Has cut out soda. Would like to work on tracking food and beverage intake, cutting back on fried foods, and drinking more water. States her achillis tendonitis has been causing pain.    Estimated daily fluid intake: 64 oz (48+ oz is water)  Supplements: unknown Current average weekly physical activity: unknown   24-Hr Dietary Recall First Meal: protein shake Snack: popcorn  Second Meal: Lean Cuisine + PB&J  Snack: hummus Third Meal: chicken + greens + corn Snack: - Beverages: water, coffee w/ flavored creamer, half tea half lemonade (2x/month)    Estimated Energy Needs Calories: 1500 Carbohydrate: 170g Protein: 112g Fat: 42g   NUTRITION DIAGNOSIS  Overweight/obesity ([place]-3.3) related to past poor dietary habits and physical inactivity as evidenced by patient w/ planned RYGB surgery following dietary guidelines for continued weight loss.   NUTRITION INTERVENTION  Nutrition counseling (C-1) and education (E-2) to facilitate bariatric surgery goals.  Pre-Op Goals Progress & New Goals . Found a variety of protein shakes  . Eating breakfast and overall eating at least 3 times per day . Identifies appropriate snacks to incorporate as needed . Monitoring portion sizes and focusing on small/frequent  meals/snacks throughout the day . NEW: Track fluid and beverage intake  . NEW: Cut back on fried foods (no more than 1x/week)  . NEW: Aim to drink at least 64 ounces of water daily  Learning Style & Readiness for Change Teaching method utilized: Visual & Auditory  Demonstrated degree of understanding via: Teach Back  Barriers to learning/adherence to lifestyle change: None Identified    MONITORING & EVALUATION Dietary intake, weekly physical activity, body weight, and pre-op goals in 1 month.   Next Steps  Patient is to return to [facility] in 1 month for 3rd SWL.

## 2019-04-29 ENCOUNTER — Other Ambulatory Visit: Payer: Self-pay

## 2019-04-29 ENCOUNTER — Ambulatory Visit: Payer: 59 | Admitting: Adult Health

## 2019-04-29 ENCOUNTER — Encounter: Payer: Self-pay | Admitting: Adult Health

## 2019-04-29 VITALS — BP 123/79 | HR 84 | Temp 97.7°F | Ht 64.0 in | Wt 280.0 lb

## 2019-04-29 DIAGNOSIS — G4733 Obstructive sleep apnea (adult) (pediatric): Secondary | ICD-10-CM

## 2019-04-29 DIAGNOSIS — Z9989 Dependence on other enabling machines and devices: Secondary | ICD-10-CM

## 2019-04-29 NOTE — Patient Instructions (Signed)
Continue using CPAP nightly and greater than 4 hours each night °If your symptoms worsen or you develop new symptoms please let us know.  ° °

## 2019-04-29 NOTE — Progress Notes (Signed)
PATIENT: [name] DOB: [birth year]  REASON FOR VISIT: follow up HISTORY FROM: patient  HISTORY OF PRESENT ILLNESS: Today 04/29/19:  [name] is a 54 year old female with a history of obstructive sleep apnea on CPAP.  Her download indicates that she use her machine 26 out of 30 days for compliance of 87%.  She use her machine greater than 4 hours each night.  On average she uses her machine 7 hours and 36 minutes.  Her residual AHI is 1.4 on 5 to 18 cm of water with EPR 2.  She does not have a significant leak.  She reports that she feels that she is sleeping better at night.  She has not noticed much difference during the day.  HISTORY (Copied from Dr.Dohmeier's note) Michelle Byrdis a 54 y.o. year old African American female patientand seen hereon 01/29/2019 upon referral by her bariatric surgeon. She is undergoing evaluation for possible OSA/ OHS before gastric bypass surgery.     Chiefconcernaccording to patient : [name] reports that she had her highest weight ever about 4 5 months ago when she weighed 295 pounds, today she is 274 pounds including.  Weight loss of approximately 20 to 25 pounds precedes her planned surgery and will reduce surgical risk.  She remains at a body mass index of 47, she has slightly elevated blood pressures, she has been told she snores but she has never been told that she stops breathing.  There has been no problems with previous sleep study.  I have the pleasure of seeing [name] today,a right -handed African American  female with  possible obesity hypoventilation. She has a medical history of Anemia, Constipation, Gallbladder problem, History of acute pancreatitis, Hypertension, Left shoulder pain, and Low back pain, and continuous weight gain to morbid obesity since age 82.   Sleeprelevant medical history: Nocturia 1-2 , there has been no Tonsillectomy, no ENT procedure, and no  cervical spine or TB injuries.   Familymedical /sleep history:no other family member on CPAP with OSA, insomnia, and no sleep walking history.  Social history:Patient is working as an Scientist, forensic, and lives in a household  alone.  Family status is divorced, without children.  The patient currently works from her office. Pets are not present. Tobacco use; quit 1999 after 12 years. ETOH use ;very  rarely ,  Caffeine intake in form of Coffee( 1 cup ) Soda( none ) Tea ( when eating out) , nor energy drinks. Regular exercise in form of walking . Hobbies: none   She lives very isolated, had no vaccine, has not contracted Covid.    Sleep habits are as follows:The patient's dinner time is between 6-8 PM. The patient goes to bed at 10-11 PM in her bedroom, after she watched TV in the living room before. She continues to sleep for 2-3  hours, wakes for two bathroom breaks, the first time at 4 AM and again at 6 AM.   The preferred sleep position is laterally , with the support of 2 pillows.  Dreams are reportedly infrequent.Marland Kitchen  7 AM is the usual rise time. The patient wakes up spontaneously with an alarm, set at 7.15 and 7.30 again..   She reports not feeling refreshed or restored in AM, with symptoms such as dry mouth, morning headaches, and these improve after 1-2 hours up and going. She has sinus pressure. She feels  residual fatigue.  Naps are taken infrequently.    REVIEW OF SYSTEMS: Out of a complete  14 system review of symptoms, the patient complains only of the following symptoms, and all other reviewed systems are negative.   ESS 4  ALLERGIES: Allergies  Allergen Reactions  . Contrast Media [Iodinated Diagnostic Agents] Nausea And Vomiting    Pt began vomiting after multihance injection     HOME MEDICATIONS: Outpatient Medications Prior to Visit  Medication Sig Dispense Refill  . allopurinol (ZYLOPRIM) 100 MG tablet Take 1 tablet (100 mg total) by mouth daily. 30 tablet 6  .  amLODipine (NORVASC) 10 MG tablet Take 10 mg by mouth at bedtime.    . Ascorbic Acid (VITAMIN C PO) Take by mouth.    . Azilsartan-Chlorthalidone (EDARBYCLOR PO) Take by mouth.    . Calcium 500-100 MG-UNIT CHEW Chew 1 tablet by mouth 2 (two) times daily.    . indapamide (LOZOL) 1.25 MG tablet Take 1.25 mg by mouth every morning.    . Multiple Vitamin (MULTIVITAMIN WITH MINERALS) TABS tablet Take 1 tablet by mouth daily.    Marland Kitchen OZEMPIC, 1 MG/DOSE, 2 MG/1.5ML SOPN INJECT 1MG  ONCE A WEEK    . Vitamin D, Ergocalciferol, (DRISDOL) 50000 units CAPS capsule Take 1 capsule (50,000 Units total) by mouth every 7 (seven) days. 4 capsule 0  . colchicine 0.6 MG tablet Take 1 tablet by mouth twice daily 30 tablet 0   No facility-administered medications prior to visit.    PAST MEDICAL HISTORY: Past Medical History:  Diagnosis Date  . Anemia   . Constipation   . Gallbladder problem   . History of acute pancreatitis   . Hypertension   . Left shoulder pain   . Low back pain     PAST SURGICAL HISTORY: Past Surgical History:  Procedure Laterality Date  . ABDOMINAL HYSTERECTOMY    . ABDOMINAL HYSTERECTOMY    . CHOLECYSTECTOMY    . PANCREAS SURGERY      FAMILY HISTORY: Family History  Problem Relation Age of Onset  . Hypertension Mother   . Hypertension Father   . Cancer Other     SOCIAL HISTORY: Social History   Socioeconomic History  . Marital status: Divorced    Spouse name: Not on file  . Number of children: Not on file  . Years of education: Not on file  . Highest education level: Not on file  Occupational History  . Occupation: Glass blower/designer: [employer]  Tobacco Use  . Smoking status: Former Smoker    Quit date: 01/01/1997    Years since quitting: 22.3  . Smokeless tobacco: Never Used  Substance and Sexual Activity  . Alcohol use: Yes    Comment: occ  . Drug use: Not on file  . Sexual activity: Not on file  Other Topics Concern  . Not on  file  Social History Narrative  . Not on file   Social Determinants of Health   Financial Resource Strain:   . Difficulty of Paying Living Expenses:   Food Insecurity:   . Worried About Charity fundraiser in the Last Year:   . Arboriculturist in the Last Year:   Transportation Needs:   . Film/video editor (Medical):   Marland Kitchen Lack of Transportation (Non-Medical):   Physical Activity:   . Days of Exercise per Week:   . Minutes of Exercise per Session:   Stress:   . Feeling of Stress :   Social Connections:   . Frequency of Communication with Friends and Family:   .  Frequency of Social Gatherings with Friends and Family:   . Attends Religious Services:   . Active Member of Clubs or Organizations:   . Attends Archivist Meetings:   Marland Kitchen Marital Status:   Intimate Partner Violence:   . Fear of Current or Ex-Partner:   . Emotionally Abused:   Marland Kitchen Physically Abused:   . Sexually Abused:       PHYSICAL EXAM  Vitals:   04/29/19 1014  BP: 123/79  Pulse: 84  Temp: 97.7 F (36.5 C)  Weight: 280 lb (127 kg)  Height: 5\' 4"  (1.626 m)   Body mass index is 48.06 kg/m.  Generalized: Well developed, in no acute distress  Chest: Lungs clear to auscultation bilaterally  Neurological examination  Mentation: Alert oriented to time, place, history taking. Follows all commands speech and language fluent Cranial nerve II-XII: Extraocular movements were full, visual field were full on confrontational test Head turning and shoulder shrug  were normal and symmetric. Motor: The motor testing reveals 5 over 5 strength of all 4 extremities. Good symmetric motor tone is noted throughout.  Sensory: Sensory testing is intact to soft touch on all 4 extremities. No evidence of extinction is noted.  Gait and station: Gait is normal.    DIAGNOSTIC DATA (LABS, IMAGING, TESTING) - I reviewed patient records, labs, notes, testing and imaging myself where available.  Lab Results  Component  Value Date   WBC 4.9 08/22/2017   HGB 13.3 08/22/2017   HCT 42.2 08/22/2017   MCV 75.5 (L) 08/22/2017   PLT 182 08/22/2017      Component Value Date/Time   NA 141 06/13/2017 1001   K 4.0 06/13/2017 1001   CL 98 06/13/2017 1001   CO2 26 06/13/2017 1001   GLUCOSE 85 06/13/2017 1001   GLUCOSE 91 08/05/2010 0540   BUN 13 06/13/2017 1001   CREATININE 0.85 06/13/2017 1001   CALCIUM 9.8 06/13/2017 1001   CALCIUM 9.1 08/01/2010 2213   PROT 6.9 08/22/2017 0955   PROT 7.4 06/13/2017 1001   ALBUMIN 4.2 06/13/2017 1001   AST 13 08/22/2017 0955   ALT 15 08/22/2017 0955   ALKPHOS 102 06/13/2017 1001   BILITOT 0.4 08/22/2017 0955   BILITOT 0.5 06/13/2017 1001   GFRNONAA 80 06/13/2017 1001   GFRAA 92 06/13/2017 1001   Lab Results  Component Value Date   CHOL 169 01/23/2017   HDL 62 01/23/2017   LDLCALC 97 01/23/2017   TRIG 48 01/23/2017   CHOLHDL 3.1 08/01/2010   Lab Results  Component Value Date   HGBA1C 5.9 (H) 06/13/2017   Lab Results  Component Value Date   VITAMINB12 511 01/23/2017   Lab Results  Component Value Date   TSH 3.320 01/23/2017      ASSESSMENT AND PLAN 54 y.o. year old female  has a past medical history of Anemia, Constipation, Gallbladder problem, History of acute pancreatitis, Hypertension, Left shoulder pain, and Low back pain. here with:  1. OSA on CPAP  - CPAP compliance excellent - Good treatment of AHI  - Encourage patient to use CPAP nightly and > 4 hours each night - F/U in 1 year or sooner if needed   I spent 20 minutes of face-to-face and non-face-to-face time with patient.  This included previsit chart review, lab review, study review, order entry, electronic health record documentation, patient education.  Ward Givens, MSN, NP-C 04/29/2019, 10:28 AM [facility] [address] [place], [place] [address] [phone]

## 2019-05-04 ENCOUNTER — Ambulatory Visit: Payer: Self-pay | Admitting: Adult Health

## 2019-05-05 ENCOUNTER — Encounter: Payer: 59 | Attending: Surgery | Admitting: Dietician

## 2019-05-05 ENCOUNTER — Encounter: Payer: Self-pay | Admitting: Dietician

## 2019-05-05 ENCOUNTER — Other Ambulatory Visit: Payer: Self-pay

## 2019-05-05 DIAGNOSIS — E669 Obesity, unspecified: Secondary | ICD-10-CM | POA: Insufficient documentation

## 2019-05-05 NOTE — Progress Notes (Signed)
Supervised Weight Loss Visit Bariatric Nutrition Education  Planned Surgery: RYGB   3rd SWL Appointment   NUTRITION ASSESSMENT  Anthropometrics  Start weight at [facility]: 277.7 lbs (date: 02/10/2019) Today's weight: 278.5 lbs BMI: 47.8 kg/m2    Lifestyle & Dietary Hx Will have a yogurt (light and fit) or protein shake in the morning. Has found several different protein shake brands/flavors she likes. Looking at nutrition facts labels for sugars to be less than 10 grams of sugar per serving, and keeping calories low (states she tries to stay around ~1500 kcals/day.) [employer] Delite, will have chicken kabob platter with double salad for side about once per week. May also have a mini [place] [employer] sub. Enjoys frozen meals, particularly Lean Cuisine and Healthy Choice. Has cut out soda, is tracking food and beverage intake, cutting back on fried foods, and drinking more water. May snack on homemade guacamole, oatmeal chewy bars (100 kcals), mini [employer], or mini Rice Krispies treats. Will add extra veggies to meals.   Estimated daily fluid intake: 64 oz (48+ oz is water)  Supplements: unknown Current average weekly physical activity: unknown   24-Hr Dietary Recall First Meal: protein shake Snack: -  Second Meal: Lean Cuisine ([place])   Snack: chewy oatmeal bar  Third Meal: Mexican shrimp + rice + salad + chips + queso  Snack: - Beverages: water, coffee w/ flavored creamer, half tea half lemonade (2x/month)    Estimated Energy Needs Calories: 1500 Carbohydrate: 170g Protein: 112g Fat: 42g   NUTRITION DIAGNOSIS  Overweight/obesity ([place]-3.3) related to past poor dietary habits and physical inactivity as evidenced by patient w/ planned RYGB surgery following dietary guidelines for continued weight loss.   NUTRITION INTERVENTION  Nutrition counseling (C-1) and education (E-2) to facilitate bariatric surgery goals.  Pre-Op Goals Progress & New Goals . Found a variety of protein  shakes  . Eating breakfast and overall eating at least 3 times per day . Identifies appropriate snacks to incorporate as needed . Monitoring portion sizes and focusing on small/frequent meals/snacks throughout the day . Tracking fluid and beverage intake  . Cutting back on fried foods (no more than 1x/week)  . Working on drinking at least 64 ounces of water daily  [place] for Change Teaching method utilized: Visual & Auditory  Demonstrated degree of understanding via: Teach Back  Barriers to learning/adherence to lifestyle change: None Identified    MONITORING & EVALUATION Dietary intake, weekly physical activity, body weight, and pre-op goals at next nutrition visit.   Next Steps  Patient is to return to [facility] for Pre-Op Class >2 weeks prior to surgery. Patient may need additional SWL visits, waiting for confirmation from [facility].

## 2019-05-07 ENCOUNTER — Encounter: Payer: Self-pay | Admitting: Neurology

## 2019-05-14 ENCOUNTER — Other Ambulatory Visit (HOSPITAL_COMMUNITY): Payer: Self-pay | Admitting: Surgery

## 2019-06-08 ENCOUNTER — Other Ambulatory Visit: Payer: Self-pay

## 2019-06-08 ENCOUNTER — Encounter: Payer: 59 | Attending: Surgery | Admitting: Skilled Nursing Facility1

## 2019-06-08 DIAGNOSIS — E669 Obesity, unspecified: Secondary | ICD-10-CM | POA: Diagnosis present

## 2019-06-09 ENCOUNTER — Encounter (HOSPITAL_COMMUNITY): Payer: Self-pay

## 2019-06-09 ENCOUNTER — Encounter (HOSPITAL_COMMUNITY)
Admission: RE | Admit: 2019-06-09 | Discharge: 2019-06-09 | Disposition: A | Discharge status: Home / Self Care | Payer: 59 | Source: Ambulatory Visit | Attending: Surgery | Admitting: Surgery

## 2019-06-09 ENCOUNTER — Other Ambulatory Visit: Payer: Self-pay

## 2019-06-09 DIAGNOSIS — Z01812 Encounter for preprocedural laboratory examination: Secondary | ICD-10-CM | POA: Diagnosis present

## 2019-06-09 HISTORY — DX: Sleep apnea, unspecified: G47.30

## 2019-06-09 NOTE — Patient Instructions (Signed)
DUE TO COVID-19 ONLY ONE VISITOR IS ALLOWED TO COME WITH YOU AND STAY IN THE WAITING ROOM ONLY DURING PRE OP AND PROCEDURE DAY OF SURGERY. THE 1 VISITOR MAY VISIT WITH YOU AFTER SURGERY IN YOUR PRIVATE ROOM DURING VISITING HOURS ONLY!  YOU NEED TO HAVE A COVID 19 TEST ON: 06/11/19 @ 9:55 am, THIS TEST MUST BE DONE BEFORE SURGERY, COME  [place], [place] , [id number].  (Zinc) ONCE YOUR COVID TEST IS COMPLETED, PLEASE BEGIN THE QUARANTINE INSTRUCTIONS AS OUTLINED IN YOUR HANDOUT.                Ileane Verdene Lennert   Your procedure is scheduled on: 06/15/19   Report to [facility] Main  Entrance   Report to short stay at: 5:30 AM     Call this number if you have problems the morning of surgery [phone]    Remember: Do not eat food or drink liquids :After Midnight.   BRUSH YOUR TEETH MORNING OF SURGERY AND RINSE YOUR MOUTH OUT, NO CHEWING GUM CANDY OR MINTS.     Take these medicines the morning of surgery with A SIP OF WATER:allopurinol,amlodipine.                                  You may not have any metal on your body including hair pins and              piercings  Do not wear jewelry, make-up, lotions, powders or perfumes, deodorant             Do not wear nail polish on your fingernails.  Do not shave  48 hours prior to surgery.               Do not bring valuables to the hospital. [facility].  Contacts, dentures or bridgework may not be worn into surgery.  Leave suitcase in the car. After surgery it may be brought to your room.     Patients discharged the day of surgery will not be allowed to drive home. IF YOU ARE HAVING SURGERY AND GOING HOME THE SAME DAY, YOU MUST HAVE AN ADULT TO DRIVE YOU HOME AND BE WITH YOU FOR 24 HOURS. YOU MAY GO HOME BY TAXI OR UBER OR ORTHERWISE, BUT AN ADULT MUST ACCOMPANY YOU HOME AND STAY WITH YOU FOR 24 HOURS.  Name and phone number of your driver:  Special  Instructions: N/A              Please read over the following fact sheets you were given: _____________________________________________________________________  [facility] - Preparing for Surgery Before surgery, you can play an important role.  Because skin is not sterile, your skin needs to be as free of germs as possible.  You can reduce the number of germs on your skin by washing with CHG (chlorahexidine gluconate) soap before surgery.  CHG is an antiseptic cleaner which kills germs and bonds with the skin to continue killing germs even after washing. Please DO NOT use if you have an allergy to CHG or antibacterial soaps.  If your skin becomes reddened/irritated stop using the CHG and inform your nurse when you arrive at Short Stay. Do not shave (including legs and underarms) for at least 48 hours prior to the  first CHG shower.  You may shave your face/neck. Please follow these instructions carefully:  1.  Shower with CHG Soap the night before surgery and the  morning of Surgery.  2.  If you choose to wash your hair, wash your hair first as usual with your  normal  shampoo.  3.  After you shampoo, rinse your hair and body thoroughly to remove the  shampoo.                           4.  Use CHG as you would any other liquid soap.  You can apply chg directly  to the skin and wash                       Gently with a scrungie or clean washcloth.  5.  Apply the CHG Soap to your body ONLY FROM THE NECK DOWN.   Do not use on face/ open                           Wound or open sores. Avoid contact with eyes, ears mouth and genitals (private parts).                       Wash face,  Genitals (private parts) with your normal soap.             6.  Wash thoroughly, paying special attention to the area where your surgery  will be performed.  7.  Thoroughly rinse your body with warm water from the neck down.  8.  DO NOT shower/wash with your normal soap after using and rinsing off  the CHG Soap.                 9.  Pat yourself dry with a clean towel.            10.  Wear clean pajamas.            11.  Place clean sheets on your bed the night of your first shower and do not  sleep with pets. Day of Surgery : Do not apply any lotions/deodorants the morning of surgery.  Please wear clean clothes to the hospital/surgery center.  FAILURE TO FOLLOW THESE INSTRUCTIONS MAY RESULT IN THE CANCELLATION OF YOUR SURGERY PATIENT SIGNATURE_________________________________  NURSE SIGNATURE__________________________________  ________________________________________________________________________

## 2019-06-09 NOTE — Progress Notes (Signed)
COVID Vaccine Completed: Date COVID Vaccine completed: COVID vaccine manufacturer: [employer]   PCP - Dr. Lucianne Lei. LOV: 09/11/17 Cardiologist -   Chest x-ray -  EKG - 01/30/19 Epic. Stress Test -  ECHO - 01/25/17 EPIC Cardiac Cath -   Sleep Study - yes yes CPAP -   Fasting Blood Sugar -  Checks Blood Sugar _____ times a day  Blood Thinner Instructions: Aspirin Instructions: Last Dose:  Anesthesia review:   Patient denies shortness of breath, fever, cough and chest pain at PAT appointment   Patient verbalized understanding of instructions that were given to them at the PAT appointment. Patient was also instructed that they will need to review over the PAT instructions again at home before surgery.

## 2019-06-11 ENCOUNTER — Other Ambulatory Visit (HOSPITAL_COMMUNITY)
Admission: RE | Admit: 2019-06-11 | Discharge: 2019-06-11 | Disposition: A | Discharge status: Home / Self Care | Payer: 59 | Source: Ambulatory Visit | Attending: Surgery | Admitting: Surgery

## 2019-06-11 ENCOUNTER — Encounter (HOSPITAL_COMMUNITY)
Admission: RE | Admit: 2019-06-11 | Discharge: 2019-06-11 | Disposition: A | Discharge status: Home / Self Care | Payer: 59 | Source: Ambulatory Visit | Attending: Surgery | Admitting: Surgery

## 2019-06-11 ENCOUNTER — Other Ambulatory Visit: Payer: Self-pay

## 2019-06-11 DIAGNOSIS — Z01812 Encounter for preprocedural laboratory examination: Secondary | ICD-10-CM | POA: Diagnosis not present

## 2019-06-11 LAB — HEMOGLOBIN A1C
Hgb A1c MFr Bld: 6 % — ABNORMAL HIGH (ref 4.8–5.6)
Mean Plasma Glucose: 125.5 mg/dL

## 2019-06-11 LAB — COMPREHENSIVE METABOLIC PANEL
ALT: 21 U/L (ref 0–44)
AST: 19 U/L (ref 15–41)
Albumin: 4.2 g/dL (ref 3.5–5.0)
Alkaline Phosphatase: 92 U/L (ref 38–126)
Anion gap: 11 (ref 5–15)
BUN: 17 mg/dL (ref 6–20)
CO2: 30 mmol/L (ref 22–32)
Calcium: 9.2 mg/dL (ref 8.9–10.3)
Chloride: 99 mmol/L (ref 98–111)
Creatinine, Ser: 0.83 mg/dL (ref 0.44–1.00)
GFR calc Af Amer: >60 mL/min (ref >60)
GFR calc non Af Amer: >60 mL/min (ref >60)
Glucose, Bld: 94 mg/dL (ref 70–99)
Potassium: 3.4 mmol/L — ABNORMAL LOW (ref 3.5–5.1)
Sodium: 140 mmol/L (ref 135–145)
Total Bilirubin: 1 mg/dL (ref 0.3–1.2)
Total Protein: 7.7 g/dL (ref 6.5–8.1)

## 2019-06-11 LAB — CBC WITH DIFFERENTIAL/PLATELET
Abs Immature Granulocytes: 0.02 10*3/uL (ref 0.00–0.07)
Basophils Absolute: 0 10*3/uL (ref 0.0–0.1)
Basophils Relative: 1 %
Eosinophils Absolute: 0 10*3/uL (ref 0.0–0.5)
Eosinophils Relative: 1 %
HCT: 40.7 % (ref 36.0–46.0)
Hemoglobin: 12.9 g/dL (ref 12.0–15.0)
Immature Granulocytes: 0 %
Lymphocytes Relative: 34 %
Lymphs Abs: 2.2 10*3/uL (ref 0.7–4.0)
MCH: 25.1 pg — ABNORMAL LOW (ref 26.0–34.0)
MCHC: 31.7 g/dL (ref 30.0–36.0)
MCV: 79.2 fL — ABNORMAL LOW (ref 80.0–100.0)
Monocytes Absolute: 0.5 10*3/uL (ref 0.1–1.0)
Monocytes Relative: 8 %
Neutro Abs: 3.7 10*3/uL (ref 1.7–7.7)
Neutrophils Relative %: 56 %
Platelets: 226 10*3/uL (ref 150–400)
RBC: 5.14 MIL/uL — ABNORMAL HIGH (ref 3.87–5.11)
RDW: 15.1 % (ref 11.5–15.5)
WBC: 6.4 10*3/uL (ref 4.0–10.5)
nRBC: 0 % (ref 0.0–0.2)

## 2019-06-11 LAB — ABO/RH: ABO/RH(D): B POS

## 2019-06-11 LAB — SARS CORONAVIRUS 2 (TAT 6-24 HRS): SARS Coronavirus 2: NEGATIVE

## 2019-06-12 NOTE — H&P (Signed)
[name]  Location: [facility] Patient #: [id number] DOB: 1965-08-17 Divorced / Language: English / Race: Black or African American Female  History of Present Illness   The patient is a 54 year old female who presents with a complaint of weight loss surgery.  The PCP is Dr. Clayburn Pert  The patient was referred by Dr. Clayburn Pert  She is by herself.  [The Covid-19 virus has disrupted normal medical care in [place] and across the nation. We have sometimes had to alter normal surgical/medical care to limit this epidemic and we have explained these changes to the patient.]  She is tentatively on for a RYGB on 06/08/2019. Since she has switched to a gastric bypass, we reviewed the surgery, postop recovery, and potential complications. Since she is gaining weight since I last saw her, I have strongly encouraged her to lose weight prior to surgery. She blames some of her weight gain on bilateral ankle problems. These are better now - so she ought to be able to walk more.  UGI - 01/30/2019 - normal, possible right nephrolithiasis Sleep apnea - 02/22/2019 - she saw Dr. Brett Fairy who started her on CPAP Psych - she saw Dr. Ardath Sax - 01/27/2019 - he told her she was a visual/smell eater - she has some tools to work with this Nutrition - saw Alvester Morin for nutrition  Prior history (01/2019): She is interested in weight loss surgery. She has a friend who is doing well with a sleeve gastrectomy, he had it done in [place]. Heart beautician's also had weight loss surgery, though she is not sure what kind. She went to a live information session by Dr. Windle Guard. She has tried multiple diets. She has tried the keto diet, Slim fast diet, and intermittent fasting. She saw a nutritionist in 2019. She tried phentermine for about a year, which worked well, but then she stopped it. She's tried some over-the-counter diet pills also. She says she'll lose about 40  pounds, giving gained weight back and then some.  Per the [facility], the patient is a candidate for bariatric surgery. The patient attended our initial information session and reviewed the types of bariatric surgery.  The patient is interested in the gastric bypass (she switched from when I first saw her). I discussed with the patient the indications and risks of bariatric surgery. The potential risks of surgery include, but are not limited to, bleeding, infection, leak from the bowel, DVT and PE, open surgery, long term nutrition consequences, and death. The patient understands the importance of compliance and long term follow-up with our group after surgery.  Review of Systems as stated in this history (HPI) or in the review of systems. Otherwise all other 12 point ROS are negative  Past Medical History: 1. Morbid obesity BMI - 47.7, Weight - 274 2. Sleep apnea Followed by Dr. Brett Fairy 3. Lap chole - 2003 - in [place] (emergent) 4. Pancreatitis in 2012 She was hospitalized at [facility] - she was seen by Dr. Carlean Purl at the time. She had an ERCP on 08/04/2010 by Carlean Purl. 5. Hysterectomy - 2007 6. She has a torn left rotator cuff She has held off on surgery - she is not sure what the surgeon's name who saw her. 7. Low back pain 8. Pre diabetic - on Ozempic (sounds like diabetes to me)   Social History: Divorced. She lives by herself. she has no children. She works for the [employer], Surveyor, quantity Her  mother, Lenon Oms, will be with her the day of surgery.  The patient's family history was non contributory.  DATA REVIEWED, COUNSELING AND COORDINATION OF CARE: I have personally seen and evaluated the patient, evaluated laboratory and imaging results, formulated the assessment and plan and placed orders. This requires high medical decision making. Total time spent with patient and  charting: 30 minutes   Medication History (Alisha Spillers, CMA; 05/14/2019 9:49 AM) Allopurinol (100MG  Tablet, Oral) Active. Edarbyclor (40-12.5MG  Tablet, Oral) Active. Medications Reconciled  Vitals (Alisha Spillers CMA; 05/14/2019 9:49 AM) 05/14/2019 9:48 AM Weight: 281.2 lb Height: 63.5in Body Surface Area: 2.25 m Body Mass Index: 49.03 kg/m  Temp.: 98.26F (Oral)  Pulse: 110 (Regular)  BP: 118/78(Sitting, Left Arm, Standard)   Physical Exam  General: Obese AA F who is alert and generally healthy appearing. She is wearing a mask. Skin: Inspection and palpation of the skin unremarkable.  Eyes: Conjunctivae white, pupils equal. Face, ears, nose, mouth, and throat: Face - She is wearing a mask.  Neck: Supple. No mass. Trachea midline. No thyroid mass.  Lymph Nodes: No supraclavicular or cervical adenopathy. No axillary adenopathy.  Lungs: Normal respiratory effort. Clear to auscultation and symmetric breath sounds. Cardiovascular: Regular rate and rhythm. Normal auscultation of the heart. No murmur or rub.  Abdomen: Soft. No mass. Liver and spleen not palpable. No tenderness. No hernia. Normal bowel sounds.  She is a little more apple than pear. She has a diastasis. Rectal: Not done.  Musculoskeletal/extremities: Both her feet/ankles are bothering her, so she said that she moves like an old woman. Good strength and ROM in upper and lower extremities.    Assessment & Plan  1.  OBESITY, MORBID, BMI 40.0-49.9 (E66.01) - BMI - 47.7, Weight - 274  Plan:  1. For RYGB   2. To send in Protonix and Zofran to your pharmacy  3. Do colon bowel prep the day before surgery  2.  HTN (HYPERTENSION) (I10) 3.  SLEEP APNEA, OBSTRUCTIVE (G47.33)  Impression: Followed by Dr. Brett Fairy  4. Pancreatitis in 2012 She was hospitalized at [facility] - she was seen by Dr. Carlean Purl at the time. She had an ERCP on 08/04/2010 by Carlean Purl. 5. She has a torn  left rotator cuff She has held off on surgery - she is not sure what the surgeon's name who saw her. 6. Low back pain 7. Pre diabetic - on Ozempic (sounds like diabetes to me)    Alphonsa Overall, MD, [facility] Office phone:  [phone]

## 2019-06-14 NOTE — Anesthesia Preprocedure Evaluation (Addendum)
Anesthesia Evaluation  Patient identified by MRN, date of birth, ID band Patient awake    Reviewed: Allergy & Precautions, NPO status , Patient's Chart, lab work & pertinent test results  History of Anesthesia Complications Negative for: history of anesthetic complications  Airway Mallampati: II  TM Distance: >3 FB Neck ROM: Full    Dental no notable dental hx. (+) Dental Advisory Given   Pulmonary sleep apnea and Continuous Positive Airway Pressure Ventilation , former smoker,    Pulmonary exam normal        Cardiovascular hypertension, Pt. on medications Normal cardiovascular exam     Neuro/Psych negative neurological ROS     GI/Hepatic negative GI ROS, Neg liver ROS,   Endo/Other  Morbid obesity  Renal/GU negative Renal ROS     Musculoskeletal negative musculoskeletal ROS (+)   Abdominal   Peds  Hematology negative hematology ROS (+)   Anesthesia Other Findings   Reproductive/Obstetrics                            Anesthesia Physical Anesthesia Plan  ASA: III  Anesthesia Plan: General   Post-op Pain Management:    Induction: Intravenous  PONV Risk Score and Plan: 4 or greater and Ondansetron, Dexamethasone, Midazolam and Scopolamine patch - Pre-op  Airway Management Planned: Oral ETT  Additional Equipment:   Intra-op Plan:   Post-operative Plan: Extubation in OR  Informed Consent: I have reviewed the patients History and Physical, chart, labs and discussed the procedure including the risks, benefits and alternatives for the proposed anesthesia with the patient or authorized representative who has indicated his/her understanding and acceptance.     Dental advisory given  Plan Discussed with: Anesthesiologist and CRNA  Anesthesia Plan Comments:        Anesthesia Quick Evaluation

## 2019-06-15 ENCOUNTER — Encounter (HOSPITAL_COMMUNITY)
Admission: RE | Disposition: A | Discharge status: Home / Self Care | Payer: Self-pay | Source: Home / Self Care | Attending: Surgery

## 2019-06-15 ENCOUNTER — Inpatient Hospital Stay (HOSPITAL_COMMUNITY)
Admission: RE | Admit: 2019-06-15 | Discharge: 2019-06-17 | DRG: 621 | Disposition: A | Discharge status: Home / Self Care | Payer: 59 | Attending: Surgery | Admitting: Surgery

## 2019-06-15 ENCOUNTER — Encounter (HOSPITAL_COMMUNITY): Payer: Self-pay | Admitting: Surgery

## 2019-06-15 ENCOUNTER — Other Ambulatory Visit: Payer: Self-pay

## 2019-06-15 ENCOUNTER — Inpatient Hospital Stay (HOSPITAL_COMMUNITY): Payer: 59 | Admitting: Anesthesiology

## 2019-06-15 DIAGNOSIS — K429 Umbilical hernia without obstruction or gangrene: Secondary | ICD-10-CM | POA: Diagnosis present

## 2019-06-15 DIAGNOSIS — G4733 Obstructive sleep apnea (adult) (pediatric): Secondary | ICD-10-CM | POA: Diagnosis present

## 2019-06-15 DIAGNOSIS — Z20822 Contact with and (suspected) exposure to covid-19: Secondary | ICD-10-CM | POA: Diagnosis present

## 2019-06-15 DIAGNOSIS — Z90710 Acquired absence of both cervix and uterus: Secondary | ICD-10-CM | POA: Diagnosis not present

## 2019-06-15 DIAGNOSIS — I10 Essential (primary) hypertension: Secondary | ICD-10-CM | POA: Diagnosis present

## 2019-06-15 DIAGNOSIS — R7303 Prediabetes: Secondary | ICD-10-CM | POA: Diagnosis present

## 2019-06-15 DIAGNOSIS — Z6841 Body Mass Index (BMI) 40.0 and over, adult: Secondary | ICD-10-CM

## 2019-06-15 DIAGNOSIS — M545 Low back pain: Secondary | ICD-10-CM | POA: Diagnosis present

## 2019-06-15 HISTORY — PX: GASTRIC ROUX-EN-Y: SHX5262

## 2019-06-15 LAB — TYPE AND SCREEN
ABO/RH(D): B POS
Antibody Screen: NEGATIVE

## 2019-06-15 LAB — HEMOGLOBIN AND HEMATOCRIT, BLOOD
HCT: 43.5 % (ref 36.0–46.0)
Hemoglobin: 13.8 g/dL (ref 12.0–15.0)

## 2019-06-15 SURGERY — LAPAROSCOPIC ROUX-EN-Y GASTRIC BYPASS WITH UPPER ENDOSCOPY
Anesthesia: General

## 2019-06-15 MED ORDER — LACTATED RINGERS IR SOLN
Status: DC | PRN
  Administered 2019-06-15: 1000 mL

## 2019-06-15 MED ORDER — PHENYLEPHRINE HCL (PRESSORS) 10 MG/ML IV SOLN
INTRAVENOUS | Status: AC
  Filled 2019-06-15: qty 1

## 2019-06-15 MED ORDER — LIDOCAINE HCL 2 % IJ SOLN
INTRAMUSCULAR | Status: AC
  Filled 2019-06-15: qty 20

## 2019-06-15 MED ORDER — DEXAMETHASONE SODIUM PHOSPHATE 10 MG/ML IJ SOLN
INTRAMUSCULAR | Status: AC
  Filled 2019-06-15: qty 1

## 2019-06-15 MED ORDER — KETAMINE HCL 10 MG/ML IJ SOLN
INTRAMUSCULAR | Status: DC | PRN
Start: 2019-06-15 — End: 2019-06-15
  Administered 2019-06-15: 30 mg via INTRAVENOUS

## 2019-06-15 MED ORDER — ENOXAPARIN SODIUM 30 MG/0.3ML ~~LOC~~ SOLN
30.0000 mg | Freq: Two times a day (BID) | SUBCUTANEOUS | Status: DC
  Administered 2019-06-15 – 2019-06-17 (×4): 30 mg via SUBCUTANEOUS
  Filled 2019-06-15 (×4): qty 0.3

## 2019-06-15 MED ORDER — DEXAMETHASONE SODIUM PHOSPHATE 10 MG/ML IJ SOLN
INTRAMUSCULAR | Status: DC | PRN
  Administered 2019-06-15: 10 mg via INTRAVENOUS

## 2019-06-15 MED ORDER — ONDANSETRON HCL 4 MG/2ML IJ SOLN
INTRAMUSCULAR | Status: DC | PRN
  Administered 2019-06-15: 4 mg via INTRAVENOUS

## 2019-06-15 MED ORDER — ONDANSETRON HCL 4 MG/2ML IJ SOLN
INTRAMUSCULAR | Status: AC
  Filled 2019-06-15: qty 2

## 2019-06-15 MED ORDER — SUGAMMADEX SODIUM 500 MG/5ML IV SOLN
INTRAVENOUS | Status: AC
  Filled 2019-06-15: qty 5

## 2019-06-15 MED ORDER — ROCURONIUM BROMIDE 10 MG/ML (PF) SYRINGE
PREFILLED_SYRINGE | INTRAVENOUS | Status: DC | PRN
  Administered 2019-06-15 (×2): 20 mg via INTRAVENOUS
  Administered 2019-06-15: 60 mg via INTRAVENOUS

## 2019-06-15 MED ORDER — MIDAZOLAM HCL 5 MG/5ML IJ SOLN
INTRAMUSCULAR | Status: DC | PRN
  Administered 2019-06-15: 2 mg via INTRAVENOUS

## 2019-06-15 MED ORDER — SUCCINYLCHOLINE CHLORIDE 200 MG/10ML IV SOSY
PREFILLED_SYRINGE | INTRAVENOUS | Status: DC | PRN
  Administered 2019-06-15: 140 mg via INTRAVENOUS

## 2019-06-15 MED ORDER — APREPITANT 40 MG PO CAPS
40.0000 mg | ORAL_CAPSULE | ORAL | Status: AC
  Administered 2019-06-15: 40 mg via ORAL
  Filled 2019-06-15: qty 1

## 2019-06-15 MED ORDER — BUPIVACAINE-EPINEPHRINE 0.5% -1:200000 IJ SOLN
INTRAMUSCULAR | Status: AC
  Filled 2019-06-15: qty 1

## 2019-06-15 MED ORDER — CHLORHEXIDINE GLUCONATE 0.12 % MT SOLN
15.0000 mL | Freq: Once | OROMUCOSAL | Status: AC
  Administered 2019-06-15: 15 mL via OROMUCOSAL

## 2019-06-15 MED ORDER — LIDOCAINE 2% (20 MG/ML) 5 ML SYRINGE
INTRAMUSCULAR | Status: DC | PRN
  Administered 2019-06-15: 80 mg via INTRAVENOUS

## 2019-06-15 MED ORDER — PHENYLEPHRINE 40 MCG/ML (10ML) SYRINGE FOR IV PUSH (FOR BLOOD PRESSURE SUPPORT)
PREFILLED_SYRINGE | INTRAVENOUS | Status: DC | PRN
  Administered 2019-06-15: 80 ug via INTRAVENOUS
  Administered 2019-06-15: 120 ug via INTRAVENOUS
  Administered 2019-06-15: 80 ug via INTRAVENOUS

## 2019-06-15 MED ORDER — MIDAZOLAM HCL 2 MG/2ML IJ SOLN
INTRAMUSCULAR | Status: AC
  Filled 2019-06-15: qty 2

## 2019-06-15 MED ORDER — TISSEEL VH 10 ML EX KIT
PACK | CUTANEOUS | Status: DC | PRN
  Administered 2019-06-15: 1

## 2019-06-15 MED ORDER — SODIUM CHLORIDE 0.9 % IV SOLN
2.0000 g | INTRAVENOUS | Status: AC
  Administered 2019-06-15: 2 g via INTRAVENOUS
  Filled 2019-06-15: qty 2

## 2019-06-15 MED ORDER — FENTANYL CITRATE (PF) 250 MCG/5ML IJ SOLN
INTRAMUSCULAR | Status: DC | PRN
  Administered 2019-06-15: 100 ug via INTRAVENOUS

## 2019-06-15 MED ORDER — ONDANSETRON HCL 4 MG/2ML IJ SOLN: 4.0000 mg | INTRAMUSCULAR | Status: DC | PRN

## 2019-06-15 MED ORDER — ACETAMINOPHEN 160 MG/5ML PO SOLN
1000.0000 mg | Freq: Three times a day (TID) | ORAL | Status: DC
  Administered 2019-06-15 – 2019-06-17 (×6): 1000 mg via ORAL
  Filled 2019-06-15 (×6): qty 40.6

## 2019-06-15 MED ORDER — ACETAMINOPHEN 500 MG PO TABS
1000.0000 mg | ORAL_TABLET | ORAL | Status: AC
  Administered 2019-06-15: 1000 mg via ORAL
  Filled 2019-06-15: qty 2

## 2019-06-15 MED ORDER — BUPIVACAINE HCL 0.25 % IJ SOLN
INTRAMUSCULAR | Status: AC
  Filled 2019-06-15: qty 1

## 2019-06-15 MED ORDER — FENTANYL CITRATE (PF) 100 MCG/2ML IJ SOLN: 25.0000 ug | INTRAMUSCULAR | Status: DC | PRN

## 2019-06-15 MED ORDER — ROCURONIUM BROMIDE 10 MG/ML (PF) SYRINGE
PREFILLED_SYRINGE | INTRAVENOUS | Status: AC
  Filled 2019-06-15: qty 10

## 2019-06-15 MED ORDER — BUPIVACAINE LIPOSOME 1.3 % IJ SUSP
20.0000 mL | Freq: Once | INTRAMUSCULAR | Status: DC
  Filled 2019-06-15: qty 20

## 2019-06-15 MED ORDER — MORPHINE SULFATE (PF) 2 MG/ML IV SOLN: 1.0000 mg | INTRAVENOUS | Status: DC | PRN

## 2019-06-15 MED ORDER — PHENYLEPHRINE 40 MCG/ML (10ML) SYRINGE FOR IV PUSH (FOR BLOOD PRESSURE SUPPORT)
PREFILLED_SYRINGE | INTRAVENOUS | Status: AC
  Filled 2019-06-15: qty 10

## 2019-06-15 MED ORDER — SCOPOLAMINE 1 MG/3DAYS TD PT72: 1.0000 | MEDICATED_PATCH | TRANSDERMAL | Status: DC

## 2019-06-15 MED ORDER — PROPOFOL 10 MG/ML IV BOLUS
INTRAVENOUS | Status: AC
  Filled 2019-06-15: qty 40

## 2019-06-15 MED ORDER — ENSURE MAX PROTEIN PO LIQD
2.0000 [oz_av] | ORAL | Status: DC
  Administered 2019-06-16 – 2019-06-17 (×13): 2 [oz_av] via ORAL

## 2019-06-15 MED ORDER — PANTOPRAZOLE SODIUM 40 MG IV SOLR
40.0000 mg | Freq: Every day | INTRAVENOUS | Status: DC
  Administered 2019-06-15 – 2019-06-16 (×2): 40 mg via INTRAVENOUS
  Filled 2019-06-15 (×2): qty 40

## 2019-06-15 MED ORDER — KCL IN DEXTROSE-NACL 20-5-0.45 MEQ/L-%-% IV SOLN
INTRAVENOUS | Status: DC
  Filled 2019-06-15 (×4): qty 1000

## 2019-06-15 MED ORDER — SUCCINYLCHOLINE CHLORIDE 200 MG/10ML IV SOSY
PREFILLED_SYRINGE | INTRAVENOUS | Status: AC
  Filled 2019-06-15: qty 10

## 2019-06-15 MED ORDER — HEPARIN SODIUM (PORCINE) 5000 UNIT/ML IJ SOLN
5000.0000 [IU] | INTRAMUSCULAR | Status: AC
  Administered 2019-06-15: 5000 [IU] via SUBCUTANEOUS
  Filled 2019-06-15: qty 1

## 2019-06-15 MED ORDER — GABAPENTIN 300 MG PO CAPS
300.0000 mg | ORAL_CAPSULE | ORAL | Status: AC
  Administered 2019-06-15: 300 mg via ORAL
  Filled 2019-06-15: qty 1

## 2019-06-15 MED ORDER — KETAMINE HCL 10 MG/ML IJ SOLN
INTRAMUSCULAR | Status: AC
  Filled 2019-06-15: qty 1

## 2019-06-15 MED ORDER — PHENYLEPHRINE HCL-NACL 10-0.9 MG/250ML-% IV SOLN
INTRAVENOUS | Status: DC | PRN
Start: 2019-06-15 — End: 2019-06-15
  Administered 2019-06-15: 40 ug/min via INTRAVENOUS

## 2019-06-15 MED ORDER — SUGAMMADEX SODIUM 200 MG/2ML IV SOLN
INTRAVENOUS | Status: DC | PRN
  Administered 2019-06-15: 500 mg via INTRAVENOUS

## 2019-06-15 MED ORDER — EPHEDRINE 5 MG/ML INJ
INTRAVENOUS | Status: AC
  Filled 2019-06-15: qty 10

## 2019-06-15 MED ORDER — LIDOCAINE 2% (20 MG/ML) 5 ML SYRINGE
INTRAMUSCULAR | Status: AC
  Filled 2019-06-15: qty 5

## 2019-06-15 MED ORDER — LACTATED RINGERS IV SOLN: INTRAVENOUS | Status: DC

## 2019-06-15 MED ORDER — PROMETHAZINE HCL 25 MG/ML IJ SOLN: 6.2500 mg | INTRAMUSCULAR | Status: DC | PRN

## 2019-06-15 MED ORDER — LIDOCAINE 20MG/ML (2%) 15 ML SYRINGE OPTIME
INTRAMUSCULAR | Status: DC | PRN
Start: 2019-06-15 — End: 2019-06-15
  Administered 2019-06-15: 1.5 mg/kg/h via INTRAVENOUS

## 2019-06-15 MED ORDER — AMLODIPINE BESYLATE 10 MG PO TABS
10.0000 mg | ORAL_TABLET | Freq: Every day | ORAL | Status: DC
  Administered 2019-06-16: 10 mg via ORAL
  Filled 2019-06-15: qty 1

## 2019-06-15 MED ORDER — ACETAMINOPHEN 500 MG PO TABS
1000.0000 mg | ORAL_TABLET | Freq: Three times a day (TID) | ORAL | Status: DC
  Filled 2019-06-15: qty 2

## 2019-06-15 MED ORDER — FENTANYL CITRATE (PF) 250 MCG/5ML IJ SOLN
INTRAMUSCULAR | Status: AC
  Filled 2019-06-15: qty 5

## 2019-06-15 MED ORDER — TISSEEL VH 10 ML EX KIT
PACK | CUTANEOUS | Status: AC
  Filled 2019-06-15: qty 2

## 2019-06-15 MED ORDER — ORAL CARE MOUTH RINSE: 15.0000 mL | Freq: Once | OROMUCOSAL | Status: AC

## 2019-06-15 MED ORDER — SCOPOLAMINE 1 MG/3DAYS TD PT72
1.0000 | MEDICATED_PATCH | TRANSDERMAL | Status: DC
  Administered 2019-06-15: 1.5 mg via TRANSDERMAL
  Filled 2019-06-15: qty 1

## 2019-06-15 MED ORDER — CHLORHEXIDINE GLUCONATE 4 % EX LIQD: 60.0000 mL | Freq: Once | CUTANEOUS | Status: DC

## 2019-06-15 MED ORDER — PROPOFOL 10 MG/ML IV BOLUS
INTRAVENOUS | Status: DC | PRN
  Administered 2019-06-15: 200 mg via INTRAVENOUS

## 2019-06-15 MED ORDER — EPHEDRINE SULFATE-NACL 50-0.9 MG/10ML-% IV SOSY
PREFILLED_SYRINGE | INTRAVENOUS | Status: DC | PRN
  Administered 2019-06-15: 20 mg via INTRAVENOUS

## 2019-06-15 MED ORDER — TISSEEL VH 10 ML EX KIT
PACK | CUTANEOUS | Status: AC
  Filled 2019-06-15: qty 1

## 2019-06-15 MED ORDER — OXYCODONE HCL 5 MG/5ML PO SOLN
5.0000 mg | Freq: Four times a day (QID) | ORAL | Status: DC | PRN
  Administered 2019-06-15 – 2019-06-17 (×4): 5 mg via ORAL
  Filled 2019-06-15 (×4): qty 5

## 2019-06-15 SURGICAL SUPPLY — 72 items
APPLIER CLIP ROT 10 11.4 M/L (STAPLE)
APPLIER CLIP ROT 13.4 12 LRG (CLIP)
BLADE SURG 15 STRL LF DISP TIS (BLADE) ×1 IMPLANT
BLADE SURG 15 STRL SS (BLADE) ×1
CABLE HIGH FREQUENCY MONO STRZ (ELECTRODE) ×2 IMPLANT
CHLORAPREP W/TINT 26 (MISCELLANEOUS) ×4 IMPLANT
CLIP APPLIE ROT 10 11.4 M/L (STAPLE) IMPLANT
CLIP APPLIE ROT 13.4 12 LRG (CLIP) IMPLANT
CLIP SUT LAPRA TY ABSORB (SUTURE) ×4 IMPLANT
COVER WAND RF STERILE (DRAPES) IMPLANT
DECANTER SPIKE VIAL GLASS SM (MISCELLANEOUS) ×2 IMPLANT
DERMABOND ADVANCED (GAUZE/BANDAGES/DRESSINGS) ×1
DERMABOND ADVANCED .7 DNX12 (GAUZE/BANDAGES/DRESSINGS) ×1 IMPLANT
DEVICE SUT QUICK LOAD TK 5 (STAPLE) IMPLANT
DEVICE SUT TI-KNOT TK 5X26 (MISCELLANEOUS) IMPLANT
DEVICE SUTURE ENDOST 10MM (ENDOMECHANICALS) ×2 IMPLANT
DISSECTOR BLUNT TIP ENDO 5MM (MISCELLANEOUS) IMPLANT
DRAIN PENROSE 0.25X18 (DRAIN) ×2 IMPLANT
GAUZE 4X4 16PLY RFD (DISPOSABLE) IMPLANT
GLOVE SURG SYN 7.5  E (GLOVE) ×1
GLOVE SURG SYN 7.5 E (GLOVE) ×1 IMPLANT
GOWN STRL REUS W/TWL XL LVL3 (GOWN DISPOSABLE) ×8 IMPLANT
HOVERMATT SINGLE USE (MISCELLANEOUS) ×2 IMPLANT
KIT BASIN (CUSTOM PROCEDURE TRAY) ×2 IMPLANT
KIT GASTRIC LAVAGE 34FR ADT (SET/KITS/TRAYS/PACK) ×2 IMPLANT
KIT TURNOVER KIT A (KITS) IMPLANT
MARKER SKIN DUAL TIP RULER LAB (MISCELLANEOUS) ×2 IMPLANT
NEEDLE SPNL 22GX3.5 QUINCKE BK (NEEDLE) ×2 IMPLANT
PACK CARDIOVASCULAR III (CUSTOM PROCEDURE TRAY) ×2 IMPLANT
PENCIL SMOKE EVACUATOR (MISCELLANEOUS) IMPLANT
RELOAD 45 VASCULAR/THIN (ENDOMECHANICALS) ×4 IMPLANT
RELOAD ENDO STITCH 2.0 (ENDOMECHANICALS) ×10
RELOAD STAPLE TA45 3.5 REG BLU (ENDOMECHANICALS) ×2 IMPLANT
RELOAD STAPLER BLUE 60MM (STAPLE) ×4 IMPLANT
RELOAD STAPLER GOLD 60MM (STAPLE) ×1 IMPLANT
RELOAD STAPLER WHITE 60MM (STAPLE) IMPLANT
SCISSORS LAP 5X45 EPIX DISP (ENDOMECHANICALS) ×2 IMPLANT
SEALANT SURGICAL APPL DUAL CAN (MISCELLANEOUS) ×2 IMPLANT
SET IRRIG TUBING LAPAROSCOPIC (IRRIGATION / IRRIGATOR) ×2 IMPLANT
SET TUBE SMOKE EVAC HIGH FLOW (TUBING) ×2 IMPLANT
SHEARS HARMONIC ACE PLUS 45CM (MISCELLANEOUS) ×2 IMPLANT
SLEEVE ADV FIXATION 12X100MM (TROCAR) ×6 IMPLANT
SLEEVE ADV FIXATION 5X100MM (TROCAR) ×2 IMPLANT
SLEEVE XCEL OPT CAN 5 100 (ENDOMECHANICALS) IMPLANT
SOL ANTI FOG 6CC (MISCELLANEOUS) ×1 IMPLANT
SOLUTION ANTI FOG 6CC (MISCELLANEOUS) ×1
STAPLER ECHELON LONG 60 440 (INSTRUMENTS) ×2 IMPLANT
STAPLER RELOAD BLUE 60MM (STAPLE) ×8
STAPLER RELOAD GOLD 60MM (STAPLE) ×2
STAPLER RELOAD WHITE 60MM (STAPLE)
SURGILUBE 2OZ TUBE FLIPTOP (MISCELLANEOUS) ×2 IMPLANT
SUT MNCRL AB 4-0 PS2 18 (SUTURE) ×2 IMPLANT
SUT RELOAD ENDO STITCH 2 48X1 (ENDOMECHANICALS) ×6
SUT RELOAD ENDO STITCH 2.0 (ENDOMECHANICALS) ×4
SUT SURGIDAC NAB ES-9 0 48 120 (SUTURE) IMPLANT
SUT VIC AB 2-0 SH 27 (SUTURE) ×1
SUT VIC AB 2-0 SH 27X BRD (SUTURE) ×1 IMPLANT
SUTURE RELOAD END STTCH 2 48X1 (ENDOMECHANICALS) ×6 IMPLANT
SUTURE RELOAD ENDO STITCH 2.0 (ENDOMECHANICALS) ×4 IMPLANT
SYR 10ML ECCENTRIC (SYRINGE) ×2 IMPLANT
SYR 20ML LL LF (SYRINGE) ×4 IMPLANT
SYR 50ML LL SCALE MARK (SYRINGE) IMPLANT
SYR CONTROL 10ML LL (SYRINGE) ×2 IMPLANT
TOWEL OR 17X26 10 PK STRL BLUE (TOWEL DISPOSABLE) ×2 IMPLANT
TRAY FOLEY MTR SLVR 16FR STAT (SET/KITS/TRAYS/PACK) ×2 IMPLANT
TROCAR ADV FIXATION 11X100MM (TROCAR) IMPLANT
TROCAR ADV FIXATION 12X100MM (TROCAR) ×2 IMPLANT
TROCAR ADV FIXATION 5X100MM (TROCAR) ×2 IMPLANT
TROCAR BLADELESS OPT 12M 100M (ENDOMECHANICALS) IMPLANT
TROCAR BLADELESS OPT 5 100 (ENDOMECHANICALS) IMPLANT
TUBING CONNECTING 10 (TUBING) ×4 IMPLANT
TUBING ENDO SMARTCAP (MISCELLANEOUS) ×2 IMPLANT

## 2019-06-15 NOTE — Transfer of Care (Signed)
Immediate Anesthesia Transfer of Care Note  Patient: [name]  Procedure(s) Performed: LAPAROSCOPIC ROUX-EN-Y GASTRIC BYPASS WITH UPPER ENDOSCOPY, ERAS Pathway (N/A )  Patient Location: PACU  Anesthesia Type:General  Level of Consciousness: sedated  Airway & Oxygen Therapy: Patient Spontanous Breathing and Patient connected to face mask oxygen  Post-op Assessment: Report given to RN and Post -op Vital signs reviewed and stable  Post vital signs: Reviewed and stable  Last Vitals:  Vitals Value Taken Time  BP 123/62 06/15/19 1031  Temp    Pulse 91 06/15/19 1032  Resp 19 06/15/19 1032  SpO2 99 % 06/15/19 1032  Vitals shown include unvalidated device data.  Last Pain:  Vitals:   06/15/19 0617  TempSrc:   PainSc: 0-No pain      Patients Stated Pain Goal: 4 (70/14/10 3013)  Complications: No complications documented.

## 2019-06-15 NOTE — Interval H&P Note (Signed)
History and Physical Interval Note:  06/15/2019 7:14 AM  [name]  has presented today for surgery, with the diagnosis of Morbid Obesity.  The various methods of treatment have been discussed with the patient and family.   Her mother is her contact.  After consideration of risks, benefits and other options for treatment, the patient has consented to  Procedure(s): LAPAROSCOPIC ROUX-EN-Y GASTRIC BYPASS WITH UPPER ENDOSCOPY, ERAS Pathway (N/A) as a surgical intervention.  The patient's history has been reviewed, patient examined, no change in status, stable for surgery.  I have reviewed the patient's chart and labs.  Questions were answered to the patient's satisfaction.     Shann Medal

## 2019-06-15 NOTE — Anesthesia Procedure Notes (Signed)
Procedure Name: Intubation Date/Time: 06/15/2019 7:28 AM Performed by: Talbot Grumbling, CRNA Pre-anesthesia Checklist: Patient identified, Emergency Drugs available, Patient being monitored and Suction available Patient Re-evaluated:Patient Re-evaluated prior to induction Oxygen Delivery Method: Circle system utilized Preoxygenation: Pre-oxygenation with 100% oxygen Induction Type: IV induction Ventilation: Mask ventilation without difficulty Laryngoscope Size: Mac and 3 Grade View: Grade I Tube type: Oral Tube size: 7.5 mm Number of attempts: 1 Airway Equipment and Method: Stylet Placement Confirmation: ETT inserted through vocal cords under direct vision,  positive ETCO2 and breath sounds checked- equal and bilateral Secured at: 22 cm Tube secured with: Tape Dental Injury: Teeth and Oropharynx as per pre-operative assessment

## 2019-06-15 NOTE — Op Note (Signed)
Preoperative diagnosis: Roux-en-Y gastric bypass  Postoperative diagnosis: Same   Procedure: Upper endoscopy   Surgeon: Gurney Maxin, M.D.  Anesthesia: Gen.   Indications for procedure: This patient was undergoing a Roux-en-Y gastric bypass.   Description of procedure: The endoscopy was placed in the mouth and into the oropharynx and under endoscopic vision it was advanced to the esophagogastric junction. The pouch was insufflated and no bleeding or bubbles were seen. The GEJ was identified at 39 cm from the teeth. The anastomosis was seen at 45 cm from the teeth and widely patent. No bleeding or leaks were detected. The scope was withdrawn without difficulty.   Gurney Maxin, M.D. General, Bariatric, & Minimally Invasive Surgery [facility]

## 2019-06-15 NOTE — Progress Notes (Signed)
Discussed post op day goals with patient including ambulation, IS, diet progression, pain, and nausea control.  BSTOP education provided including BSTOP information guide, "Guide for Pain Management after your Bariatric Procedure".  Questions answered. 

## 2019-06-15 NOTE — Anesthesia Postprocedure Evaluation (Signed)
Anesthesia Post Note  Patient: Jolie NADEA KIRKLAND  Procedure(s) Performed: LAPAROSCOPIC ROUX-EN-Y GASTRIC BYPASS WITH UPPER ENDOSCOPY, ERAS Pathway (N/A )     Patient location during evaluation: PACU Anesthesia Type: General Level of consciousness: sedated Pain management: pain level controlled Vital Signs Assessment: post-procedure vital signs reviewed and stable Respiratory status: spontaneous breathing and respiratory function stable Cardiovascular status: stable Postop Assessment: no apparent nausea or vomiting Anesthetic complications: no   No complications documented.  Last Vitals:  Vitals:   06/15/19 1115 06/15/19 1130  BP: 119/65 127/67  Pulse: 86 90  Resp: 20 (!) 22  Temp:  37 C  SpO2: 90% 90%    Last Pain:  Vitals:   06/15/19 1130  TempSrc:   PainSc: Asleep                 Minard Millirons DANIEL

## 2019-06-15 NOTE — Progress Notes (Signed)
PHARMACY CONSULT FOR:  Risk Assessment for Post-Discharge VTE Following Bariatric Surgery  Post-Discharge VTE Risk Assessment: This patient's probability of 30-day post-discharge VTE is increased due to the factors marked:   Female    Age >/=60 years    BMI >/=50 kg/m2    CHF    Dyspnea at Rest    Paraplegia  x  Non-gastric-band surgery    Operation Time >/=3 hr    Return to OR     Length of Stay >/= 3 d      Hx of VTE   Hypercoagulable condition   Significant venous stasis   Predicted probability of 30-day post-discharge VTE: 0.16%  Other patient-specific factors to consider:   Recommendation for Discharge: No pharmacologic prophylaxis post-discharge     [name] is a 54 y.o. female who underwent laparoscopic Roux-en-Y gastric bypass on 06/15/19   Case start: 0745 Case end: 1020   Allergies  Allergen Reactions  . Contrast Media [Iodinated Diagnostic Agents] Nausea And Vomiting    Pt began vomiting after multihance injection     Patient Measurements: Height: 5\' 4"  (162.6 cm) Weight: 124.6 kg (274 lb 12.8 oz) IBW/kg (Calculated) : 54.7 Body mass index is 47.17 kg/m.  No results for input(s): WBC, HGB, HCT, PLT, APTT, CREATININE, LABCREA, CREATININE, CREAT24HRUR, MG, PHOS, ALBUMIN, PROT, ALBUMIN, AST, ALT, ALKPHOS, BILITOT, BILIDIR, IBILI in the last 72 hours. Estimated Creatinine Clearance: 102.3 mL/min (by C-G formula based on SCr of 0.83 mg/dL).    Past Medical History:  Diagnosis Date  . Anemia   . Constipation   . Gallbladder problem   . History of acute pancreatitis   . Hypertension   . Left shoulder pain   . Low back pain   . Sleep apnea    CPAP     Medications Prior to Admission  Medication Sig Dispense Refill Last Dose  . allopurinol (ZYLOPRIM) 100 MG tablet Take 1 tablet (100 mg total) by mouth daily. 30 tablet 6 06/15/2019 at 0445  . amLODipine (NORVASC) 10 MG tablet Take 10 mg by mouth at bedtime.   06/15/2019 at 0445  . Ascorbic  Acid (VITAMIN C PO) Take 2 tablets by mouth daily.    06/13/2019  . Azilsartan-Chlorthalidone (EDARBYCLOR) 40-12.5 MG TABS Take 1 tablet by mouth daily.    06/14/2019 at Unknown time  . Calcium 500-100 MG-UNIT CHEW Chew 2 tablets by mouth daily.    06/13/2019  . Cholecalciferol (VITAMIN D3 GUMMIES ADULT PO) Take 2 tablets by mouth daily.   06/13/2019  . Cyanocobalamin ([employer] B12 GUMMIES PO) Take 2 tablets by mouth daily.   06/13/2019  . indapamide (LOZOL) 1.25 MG tablet Take 1.25 mg by mouth every morning.   Past Week at Unknown time  . Multiple Vitamin (MULTIVITAMIN WITH MINERALS) TABS tablet Take 1 tablet by mouth daily.   06/13/2019  . acidophilus (RISAQUAD) CAPS capsule Take 1 capsule by mouth daily.   Unknown at Unknown time  . Vitamin D, Ergocalciferol, (DRISDOL) 50000 units CAPS capsule Take 1 capsule (50,000 Units total) by mouth every 7 (seven) days. (Patient not taking: Reported on 06/05/2019) 4 capsule 0 Not Taking at Unknown time       Kara Mead 06/15/2019,1:42 PM

## 2019-06-15 NOTE — Op Note (Signed)
PATIENT:   [name] DOB:   04-03-1965 MRN:   [id number]  DATE OF PROCEDURE: 06/15/2019                   FACILITY:  [facility]  OPERATIVE REPORT  PREOPERATIVE DIAGNOSIS:  Morbid obesity.  POSTOPERATIVE DIAGNOSIS:  Morbid obesity (weight 281, BMI of 49), adhesions to lower midline, small umbilical hernia  PROCEDURE:  Laparoscopic Roux-en-Y gastric bypass, antecolic, antegastric (intraoperative upper endoscopy by Dr. Kieth Brightly)  SURGEON:  Fenton Malling. Lucia Gaskins, MD  FIRST ASSISTANTSherrill Raring, MD  ANESTHESIA:  General endotracheal.  Anesthesiologist: Duane Boston, MD CRNA: Mitzie Na, CRNA; Talbot Grumbling, CRNA  General  ESTIMATED BLOOD LOSS:  Minimal.  LOCAL ANESTHESIA:  30 cc of 1/4% Marcaine + 20 cc of Exparel  COMPLICATIONS:  None.  INDICATION FOR SURGERY:  [name] is a 54 y.o. AA female who sees Lucianne Lei, MD as her primary care doctor.  She has completed our preoperative bariatric program and now comes for a laparoscopic Roux-en-Y gastric bypass.  The indications, potential complications of surgery were explained to the patient.  Potential complications of the surgery include, but are not limited to, bleeding, infection, DVT, open surgery, and long-term nutritional consequences.  OPERATIVE NOTE:  The patient taken to room #1 at [facility] where Ms. Gunnerson underwent a general endotracheal anesthetic, supervised by Anesthesiologist: Duane Boston, MD CRNA: Mitzie Na, CRNA; Talbot Grumbling, CRNA.  The patient was given 2 g of cefotetan at the beginning of the procedure.  A time-out was held and surgical checklist run.  The abdomen was prepped with ChloraPrep and sterilely draped.  I accessed the abdominal cavity through the left upper quadrant using a 12 mm Optiview trocar.  I placed 6 additional trocars: 5 mm subxiphoid, 12 mm right subcostal, 12 mm right paramedian, 12 mm left paramedian, 5 mm lateral subcostal, and a 5 mm below to the right of the  umbilicus.  I placed a block along both abdominal side walls (20 cc per side) using a mixture of Exparel and Marcaine.  The abdomen was insufflated and abdominal exploration carried out.  Right and left lobes of liver unremarkable.  The stomach that I could see was unremarkable.  The patient had a moderate amount of greater omentum which draped over the bowel.  The omentum was also stuck to the lower abdominal wall.  I spent 15 minutes taking down the omentum.  She also had a small umbilical hernia that I left alone.  I was able to push the omentum and transverse colon up and identified the ligament of Treitz to start the operation.  I measured 40 cm of the jejunum, starting at the ligament of Tritz, and divided the jejunum with a white load of 45 mm Ethicon Endo-GIA stapler.  I divided a short length into the mesentery.  I measured 100 cm of jejunum for the future gastric limb.  I put a Penrose drain on the future gastric limb of the jejunum.  I then did a side-to-side jejunojejunostomy.   I used a 45 mm white load of the Ethicon Endo-GIA stapler for the anastomosis.  I closed the enterotomy with 2 running 2-0 Vicryl sutures.  I tested the JJ anastomosis with an alligator forceps and then covered this with Tisseel.  I closed the mesenteric defect with a running 2-0 silk suture with a Laparo-tye on each end.  I then divided the omentum with a Harmonic Scalpel.  I positioned  the patient in reverse Trendelenburg and placed the liver retractor, which was introduced into the peritoneal cavity through a subxiphoid 5 mm trocar puncture, under the left lobe of the liver.  I then identified the gastroesophageal junction.  I went to the left at the angle of His and made a window at the left side of the esophago-gastric junction for a target as my dissection.  I then went on the lesser curve of the stomach, measured 5 cm from the gastroesophageal junction down the lesser curve and dissected into the lesser sac  from the lesser curvature side of the stomach.  I did the first firing of a 60 mm gold load Ethicon Eschelon stapler and then did 4 firings of the 60 mm blue load Ethicon Eschelon stapler.  This created a gastric pouch approximately 5 cm in length and 3 cm in width.  There was no bleeding from either the pouch or the stomach remnant site.  I placed Tisseel on the pouch side along the new greater curvature.  I over sewed the gastric remnant with a locking 2-0 Vicryl suture with a Laparo-tye on each end..  I then brought the jejunum ante-colic, ante-gastric up to the new stomach pouch and placed a posterior running 2-0 Vicryl suture.  I then made an enterotomy into the stomach using the Ewald as a back stop and an enterotomy into the jejunum.  I did a stapled side-to-side gastrojejunal anastomosis using these two enterotomies with a 45 mm blue load of the Ethicon Endo GIA stapler.  I tried to create a 2.5 cm gastrojejunal anastomosis.  I closed the enterotomy with a 2 running 2-0 Vicryl sutures.  I passed the Ewald tube through the gastrojejunal anastomosis and then did an anterior Connell suture running of 2-0 Vicryl suture for the anterior layer of the gastrojejunostomy.  The Ewald tube was then removed without difficulty.  I then closed the [place] defect with a figure-of-eight 2-0 silk suture between the mesentery of the transverse colon and the mesentery of the distal jejunum.  Dr. Fuller Plan then scrubbed out and did an intraoperative upper endoscopy.  He identified the esophagogastric junction about 39 cm, the gastrojejunal anastomosis about 45 cm.  I clamped off the small bowel.  He insufflated air and I flooded the abdomen with saline. There was no bubbling or evidence of air leak.  He then withdrew the scope and he will dictate that portion of the operation.    I then re-inspected the anastomoses, sucked out the saline, placed Tisseel over the stomach pouch and gastrojejunal anastomosis.   The  liver retractor was removed.  The trocars were removed.  There was no bleeding at any trocar site.  I infiltrated 10 cc of the remaining local at the trocar sites.  The skin at each trocar site was closed with a 4-0 Monocryl suture.  After the skin incisions were closed with sutures they were painted with DermaBond.  I have a surgeon as a first assist to retract, expose, and assist on this difficult operation.  The sponge and needle count were correct at the end of the case.  The patient tolerated the procedure well, was transported to the recovery room in good condition.   Alphonsa Overall, MD, [facility] Surgery Office phone:  [phone]

## 2019-06-15 NOTE — Progress Notes (Signed)
Pre-Operative Nutrition Class:  Appt start time: 7493   End time:  1830.  Patient was seen on 06/08/2019  for Pre-Operative Bariatric Surgery Education at the [facility].     The following the learning objectives were met by the patient during this course:  Identify Pre-Op Dietary Goals and will begin 2 weeks pre-operatively  Identify appropriate sources of fluids and proteins   State protein recommendations and appropriate sources pre and post-operatively  Identify Post-Operative Dietary Goals and will follow for 2 weeks post-operatively  Identify appropriate multivitamin and calcium sources  Describe the need for physical activity post-operatively and will follow MD recommendations  State when to call healthcare provider regarding medication questions or post-operative complications  Handouts given during class include:  Pre-Op Bariatric Surgery Diet Handout  Protein Shake Handout  Post-Op Bariatric Surgery Nutrition Handout  BELT Program Information Flyer  Support Group Information Flyer  [facility] Bariatric Supplements Price List  Follow-Up Plan: Patient will follow-up at [facility] 2 weeks post operatively for diet advancement per MD.

## 2019-06-15 NOTE — Progress Notes (Signed)
Patient has ambulated,voided, is using her incentive spirometer and vitals are stable. Patient started drinking first 2oz cup of water at 1450.

## 2019-06-16 ENCOUNTER — Encounter (HOSPITAL_COMMUNITY): Payer: Self-pay | Admitting: Surgery

## 2019-06-16 LAB — CBC WITH DIFFERENTIAL/PLATELET
Abs Immature Granulocytes: 0.05 10*3/uL (ref 0.00–0.07)
Basophils Absolute: 0 10*3/uL (ref 0.0–0.1)
Basophils Relative: 0 %
Eosinophils Absolute: 0 10*3/uL (ref 0.0–0.5)
Eosinophils Relative: 0 %
HCT: 37.8 % (ref 36.0–46.0)
Hemoglobin: 12 g/dL (ref 12.0–15.0)
Immature Granulocytes: 0 %
Lymphocytes Relative: 14 %
Lymphs Abs: 1.8 10*3/uL (ref 0.7–4.0)
MCH: 25.2 pg — ABNORMAL LOW (ref 26.0–34.0)
MCHC: 31.7 g/dL (ref 30.0–36.0)
MCV: 79.4 fL — ABNORMAL LOW (ref 80.0–100.0)
Monocytes Absolute: 0.7 10*3/uL (ref 0.1–1.0)
Monocytes Relative: 5 %
Neutro Abs: 10.5 10*3/uL — ABNORMAL HIGH (ref 1.7–7.7)
Neutrophils Relative %: 81 %
Platelets: 200 10*3/uL (ref 150–400)
RBC: 4.76 MIL/uL (ref 3.87–5.11)
RDW: 15.1 % (ref 11.5–15.5)
WBC: 13.1 10*3/uL — ABNORMAL HIGH (ref 4.0–10.5)
nRBC: 0 % (ref 0.0–0.2)

## 2019-06-16 NOTE — Progress Notes (Signed)
Nutrition Note  RD consulted for diet education for patient s/p bariatric surgery. Bariatric nurse coordinator providing education.  If nutrition issues arise, please consult RD.   Cypher Paule, MS, RD, LDN Inpatient Clinical Dietitian Contact information available via Amion  

## 2019-06-16 NOTE — Progress Notes (Signed)
Patient alert and oriented, Post op day 1.  Provided support and encouragement.  Encouraged pulmonary toilet (IS 750 x 10), ambulation and small sips of liquids.  Completed 12 ounces of bari clear fluids, started protein this morning.   All questions answered.  Will continue to monitor.

## 2019-06-16 NOTE — Progress Notes (Signed)
[place] [facility] Office:  [phone] General Surgery Progress Note   LOS: 1 day  POD -  1 Day Post-Op  Assessment and Plan: 1.  LAPAROSCOPIC ROUX-EN-Y GASTRIC BYPASS WITH UPPER ENDOSCOPY - 06/16/2019 - Lucia Gaskins  Doing okay.    On protein drinks  2.  HTN (HYPERTENSION) (I10) 3.  SLEEP APNEA, OBSTRUCTIVE (G47.33) 4. Pancreatitis in 2012 5. She has a torn left rotator cuff 6. Pre diabetic - on Ozempic  7.  DVT prophylaxis - Lovenox   Active Problems:   Morbid obesity with BMI of 45.0-49.9, adult (HCC)  Subjective:  Has done well so far.  No nausea.  Needs to ambulate a little more.  Objective:   Vitals:   06/16/19 0136 06/16/19 0615  BP: 102/62 129/73  Pulse: 67 63  Resp: 16 15  Temp: 98 F (36.7 C) 97.6 F (36.4 C)  SpO2: 93% 92%     Intake/Output from previous day:  06/14 0701 - 06/15 0700 In: 3068.7 [P.O.:420; I.V.:2548.7; IV Piggyback:100] Out: 8563 [Urine:1450; Blood:25]  Intake/Output this shift:  Total I/O In: -  Out: 300 [Urine:300]   Physical Exam:   General: Obese AA F who is alert and oriented.    HEENT: Normal. Pupils equal. .   Lungs: Clear   Abdomen: Soft.  Rare BS   Wound: Look good   Lab Results:    Recent Labs    06/15/19 1400 06/16/19 0508  WBC  --  13.1*  HGB 13.8 12.0  HCT 43.5 37.8  PLT  --  200    BMET  No results for input(s): NA, K, CL, CO2, GLUCOSE, BUN, CREATININE, CALCIUM in the last 72 hours.  PT/INR  No results for input(s): LABPROT, INR in the last 72 hours.  ABG  No results for input(s): PHART, HCO3 in the last 72 hours.  Invalid input(s): PCO2, PO2   Studies/Results:  No results found.   Anti-infectives:   Anti-infectives (From admission, onward)   Start     Dose/Rate Route Frequency Ordered Stop   06/15/19 0600  cefoTEtan (CEFOTAN) 2 g in sodium chloride 0.9 % 100 mL IVPB        2 g 200 mL/hr over 30 Minutes Intravenous On call to O.R. 06/15/19 0545 06/15/19 0805      Alphonsa Overall, MD,  [facility] Surgery Office: [phone] 06/16/2019

## 2019-06-17 LAB — CBC WITH DIFFERENTIAL/PLATELET
Abs Immature Granulocytes: 0.01 10*3/uL (ref 0.00–0.07)
Basophils Absolute: 0 10*3/uL (ref 0.0–0.1)
Basophils Relative: 0 %
Eosinophils Absolute: 0.1 10*3/uL (ref 0.0–0.5)
Eosinophils Relative: 2 %
HCT: 36.3 % (ref 36.0–46.0)
Hemoglobin: 11.5 g/dL — ABNORMAL LOW (ref 12.0–15.0)
Immature Granulocytes: 0 %
Lymphocytes Relative: 32 %
Lymphs Abs: 2.8 10*3/uL (ref 0.7–4.0)
MCH: 25.2 pg — ABNORMAL LOW (ref 26.0–34.0)
MCHC: 31.7 g/dL (ref 30.0–36.0)
MCV: 79.4 fL — ABNORMAL LOW (ref 80.0–100.0)
Monocytes Absolute: 0.6 10*3/uL (ref 0.1–1.0)
Monocytes Relative: 7 %
Neutro Abs: 5.2 10*3/uL (ref 1.7–7.7)
Neutrophils Relative %: 59 %
Platelets: 187 10*3/uL (ref 150–400)
RBC: 4.57 MIL/uL (ref 3.87–5.11)
RDW: 15.2 % (ref 11.5–15.5)
WBC: 8.8 10*3/uL (ref 4.0–10.5)
nRBC: 0 % (ref 0.0–0.2)

## 2019-06-17 MED ORDER — OXYCODONE HCL 5 MG PO TABS: 5.0000 mg | ORAL_TABLET | Freq: Four times a day (QID) | ORAL | 0 refills | Status: AC | PRN

## 2019-06-17 NOTE — Discharge Summary (Signed)
Physician Discharge Summary  Patient ID:  [name]  MRN: [id number]  DOB/AGE: Jul 15, 1965 54 y.o.  Admit date: 06/15/2019 Discharge date: 06/17/2019  Discharge Diagnoses:  1.  Morbid obesity  weight 281, BMI of 49 2.HTN (HYPERTENSION) (I10) 3.SLEEP APNEA, OBSTRUCTIVE (G47.33) 4. Pancreatitis in 2012 5. She has a torn left rotator cuff 6. Pre diabetic - on Ozempic    Active Problems:   Morbid obesity with BMI of 45.0-49.9, adult ([place])  Operation: Procedure(s):  LAPAROSCOPIC ROUX-EN-Y GASTRIC BYPASS WITH UPPER ENDOSCOPY - 06/15/2019 - Lucia Gaskins  Discharged Condition: good  Hospital Course: Ashaunte OLGA BOURBEAU is an 54 y.o. female whose primary care physician is Lucianne Lei, MD and who was admitted 06/15/2019 with a chief complaint of morbid obesity.   She was brought to the operating room on 06/15/2019 and underwent LAPAROSCOPIC ROUX-EN-Y GASTRIC BYPASS WITH UPPER ENDOSCOPY.  She started liquids the day of surgery and advanced to her protein shakes. She is now 2 days post op, taking po's well, and ready to go home.  The discharge instructions were reviewed with the patient.  Consults: None  Significant Diagnostic Studies: Results for orders placed or performed during the hospital encounter of 06/15/19  Hemoglobin and hematocrit, blood  Result Value Ref Range   Hemoglobin 13.8 12.0 - 15.0 g/dL   HCT 43.5 36 - 46 %  CBC WITH DIFFERENTIAL  Result Value Ref Range   WBC 13.1 (H) 4.0 - 10.5 K/uL   RBC 4.76 3.87 - 5.11 MIL/uL   Hemoglobin 12.0 12.0 - 15.0 g/dL   HCT 37.8 36 - 46 %   MCV 79.4 (L) 80.0 - 100.0 fL   MCH 25.2 (L) 26.0 - 34.0 pg   MCHC 31.7 30.0 - 36.0 g/dL   RDW 15.1 11.5 - 15.5 %   Platelets 200 150 - 400 K/uL   nRBC 0.0 0.0 - 0.2 %   Neutrophils Relative % 81 %   Neutro Abs 10.5 (H) 1.7 - 7.7 K/uL   Lymphocytes Relative 14 %   Lymphs Abs 1.8 0.7 - 4.0 K/uL   Monocytes Relative 5 %   Monocytes Absolute 0.7 0 - 1 K/uL   Eosinophils Relative 0 %    Eosinophils Absolute 0.0 0 - 0 K/uL   Basophils Relative 0 %   Basophils Absolute 0.0 0 - 0 K/uL   Immature Granulocytes 0 %   Abs Immature Granulocytes 0.05 0.00 - 0.07 K/uL  CBC with Differential  Result Value Ref Range   WBC 8.8 4.0 - 10.5 K/uL   RBC 4.57 3.87 - 5.11 MIL/uL   Hemoglobin 11.5 (L) 12.0 - 15.0 g/dL   HCT 36.3 36 - 46 %   MCV 79.4 (L) 80.0 - 100.0 fL   MCH 25.2 (L) 26.0 - 34.0 pg   MCHC 31.7 30.0 - 36.0 g/dL   RDW 15.2 11.5 - 15.5 %   Platelets 187 150 - 400 K/uL   nRBC 0.0 0.0 - 0.2 %   Neutrophils Relative % 59 %   Neutro Abs 5.2 1.7 - 7.7 K/uL   Lymphocytes Relative 32 %   Lymphs Abs 2.8 0.7 - 4.0 K/uL   Monocytes Relative 7 %   Monocytes Absolute 0.6 0 - 1 K/uL   Eosinophils Relative 2 %   Eosinophils Absolute 0.1 0 - 0 K/uL   Basophils Relative 0 %   Basophils Absolute 0.0 0 - 0 K/uL   Immature Granulocytes 0 %   Abs Immature Granulocytes 0.01 0.00 -  0.07 K/uL    No results found.  Discharge Exam:  Vitals:   06/17/19 0552 06/17/19 0700  BP: 107/61   Pulse: 76   Resp: 16   Temp: 98.2 F (36.8 C)   SpO2: (!) 73% 93%    General: Obese AA F who is alert and generally healthy appearing.  Lungs: Clear to auscultation and symmetric breath sounds. Heart:  RRR. No murmur or rub. Abdomen: Soft. No mass. No tenderness. No hernia. Has bowel sounds.  Incisions look good.  Discharge Medications:   Allergies as of 06/17/2019      Reactions   Contrast Media [iodinated Diagnostic Agents] Nausea And Vomiting   Pt began vomiting after multihance injection       Medication List    STOP taking these medications   Vitamin D (Ergocalciferol) 1.25 MG (50000 UNIT) Caps capsule Commonly known as: DRISDOL     TAKE these medications   acidophilus Caps capsule Take 1 capsule by mouth daily.   allopurinol 100 MG tablet Commonly known as: ZYLOPRIM Take 1 tablet (100 mg total) by mouth daily.   amLODipine 10 MG tablet Commonly known as: NORVASC Take 10 mg  by mouth at bedtime. Notes to patient: Monitor Blood Pressure Daily and keep a log for primary care physician.  You may need to make changes to your medications with rapid weight loss.     Calcium 500-100 MG-UNIT Chew Chew 2 tablets by mouth daily.   [employer] B12 GUMMIES PO Take 2 tablets by mouth daily.   Edarbyclor 40-12.5 MG Tabs Generic drug: Azilsartan-Chlorthalidone Take 1 tablet by mouth daily. Notes to patient: Monitor Blood Pressure Daily and keep a log for primary care physician.  You may need to make changes to your medications with rapid weight loss.     indapamide 1.25 MG tablet Commonly known as: LOZOL Take 1.25 mg by mouth every morning. Notes to patient: Monitor Blood Pressure Daily and keep a log for primary care physician.  Monitor for symptoms of dehydration.  You may need to make changes to your medications with rapid weight loss.     multivitamin with minerals Tabs tablet Take 1 tablet by mouth daily.   oxyCODONE 5 MG immediate release tablet Commonly known as: Oxy IR/ROXICODONE Take 1 tablet (5 mg total) by mouth every 6 (six) hours as needed for severe pain.   VITAMIN C PO Take 2 tablets by mouth daily.   VITAMIN D3 GUMMIES ADULT PO Take 2 tablets by mouth daily.       Disposition: Discharge disposition: 01-Home or Self Care       Discharge Instructions    Ambulate hourly while awake   Complete by: As directed    Call MD for:  difficulty breathing, headache or visual disturbances   Complete by: As directed    Call MD for:  persistant dizziness or light-headedness   Complete by: As directed    Call MD for:  persistant nausea and vomiting   Complete by: As directed    Call MD for:  redness, tenderness, or signs of infection (pain, swelling, redness, odor or green/yellow discharge around incision site)   Complete by: As directed    Call MD for:  severe uncontrolled pain   Complete by: As directed    Call MD for:  temperature >101 F   Complete by:  As directed    Diet bariatric full liquid   Complete by: As directed        Follow-up Information  Alphonsa Overall, MD. Daphane Shepherd on 07/10/2019.   Specialty: General Surgery Why: at 830 am, Please arrive 15 minutes prior to appointment.  Thank you Contact information: [address] [place] [address] [phone]        Carlena Hurl, PA-C. Go on 08/06/2019.   Specialty: General Surgery Why: at 830 am Contact information: [place] [address] [phone]                Signed: Alphonsa Overall, M.D., [facility] Office:  [phone]  06/17/2019, 7:57 AM

## 2019-06-17 NOTE — Progress Notes (Signed)
Discharge instructions given to patient and all questions were answered.  

## 2019-06-17 NOTE — Progress Notes (Signed)
Patient alert and oriented, Post op day 2.  Provided support and encouragement.  Encouraged pulmonary toilet, ambulation and small sips of liquids.  Tolerating protein and clear fluids without nausea or pain.  All questions answered.  Will continue to monitor.

## 2019-06-17 NOTE — Discharge Instructions (Signed)
GASTRIC BYPASS  Home Care Instructions   These instructions are to help you care for yourself when you go home.  Call: If you have any problems.  Call [phone] and ask for the surgeon on call  If you need immediate help, come to the ER at [facility].   Tell the ER staff that you are a new post-op gastric bypass or gastric sleeve patient   Signs and symptoms to report:  Severe vomiting or nausea o If you cannot keep down clear liquids for longer than 1 day, call your surgeon   Abdominal pain that does not get better after taking your pain medication  Fever over 100.4 F with chills  Heart beating over 100 beats a minute  Shortness of breath at rest  Chest pain   Redness, swelling, drainage, or foul odor at incision (surgical) sites   If your incisions open or pull apart  Swelling or pain in calf (lower leg)  Diarrhea (Loose bowel movements that happen often), frequent watery, uncontrolled bowel movements  Constipation, (no bowel movements for 3 days) if this happens: Pick one o Milk of Magnesia, 2 tablespoons by mouth, 3 times a day for 2 days if needed o Stop taking Milk of Magnesia once you have a bowel movement o Call your doctor if constipation continues Or o Miralax  (instead of Milk of Magnesia) following the label instructions o Stop taking Miralax once you have a bowel movement o Call your doctor if constipation continues  Anything you think is not normal   Normal side effects after surgery:  Unable to sleep at night or unable to focus  Irritability or moody  Being tearful (crying) or depressed These are common complaints, possibly related to your anesthesia medications that put you to sleep, stress of surgery, and change in lifestyle.  This usually goes away a few weeks after surgery.  If these feelings continue, call your primary care doctor.   Wound Care: You may have surgical glue, steri-strips, or staples over your incisions after  surgery  Surgical glue:  Looks like a clear film over your incisions and will wear off a little at a time  Steri-strips: Strips of tape over your incisions. You may notice a yellowish color on the skin under the steri-strips. This is used to make the   steri-strips stick better. Do not pull the steri-strips off - let them fall off  Staples: Staples may be removed before you leave the hospital o If you go home with staples, call [facility], [phone]) [phone] at for an appointment with your surgeons nurse to have staples removed 10 days after surgery.  Showering: You may shower two (2) days after your surgery unless your surgeon tells you differently o Wash gently around incisions with warm soapy water, rinse well, and gently pat dry  o No tub baths until staples are removed, steri-strips fall off or glue is gone.    Medications:  Medications should be liquid or crushed if larger than the size of a dime  Extended release pills (medication that release a little bit at a time through the day) should NOT be crushed or cut. (examples include XL, ER, DR, SR)  Depending on the size and number of medications you take, you may need to space (take a few throughout the day)/change the time you take your medications so that you do not over-fill your pouch (smaller stomach)  Make sure you follow-up with your primary care doctor to  make medication changes needed during rapid weight loss and life-style changes °• If you have diabetes, follow up with the doctor that orders your diabetes medication(s) within one week after surgery and check your blood sugar regularly. °• Do not drive while taking prescription pain medication  °• It is ok to take Tylenol by the bottle instructions with your pain medicine or instead of your pain medicine as needed.  DO NOT TAKE NSAIDS (EXAMPLES OF NSAIDS:  IBUPROFREN/ NAPROXEN)  °Diet:                    First 2 Weeks ° You will see the dietician t about two (2) weeks  after your surgery. The dietician will increase the types of foods you can eat if you are handling liquids well: °• If you have severe vomiting or nausea and cannot keep down clear liquids lasting longer than 1 day, call your surgeon @ ([phone]) °Protein Shake °• Drink at least 2 ounces of shake 5-6 times per day °• Each serving of protein shakes (usually 8 - 12 ounces) should have: °o 15 grams of protein  °o And no more than 5 grams of carbohydrate  °• Goal for protein each day: °o Men = 80 grams per day °o Women = 60 grams per day °• Protein powder may be added to fluids such as non-fat milk or Lactaid milk or unsweetened Soy/Almond milk (limit to 35 grams added protein powder per serving) ° °Hydration °• Slowly increase the amount of water and other clear liquids as tolerated (See Acceptable Fluids) °• Slowly increase the amount of protein shake as tolerated  °•  Sip fluids slowly and throughout the day.  Do not use straws. °• May use sugar substitutes in small amounts (no more than 6 - 8 packets per day; i.e. Splenda) ° °Fluid Goal °• The first goal is to drink at least 8 ounces of protein shake/drink per day (or as directed by the nutritionist); some examples of protein shakes are Syntrax Nectar, Adkins Advantage, EAS Edge HP, and Unjury. See handout from pre-op Bariatric Education Class: °o Slowly increase the amount of protein shake you drink as tolerated °o You may find it easier to slowly sip shakes throughout the day °o It is important to get your proteins in first °• Your fluid goal is to drink 64 - 100 ounces of fluid daily °o It may take a few weeks to build up to this °• 32 oz (or more) should be clear liquids  °And  °• 32 oz (or more) should be full liquids (see below for examples) °• Liquids should not contain sugar, caffeine, or carbonation ° °Clear Liquids: °• Water or Sugar-free flavored water (i.e. Fruit H2O, Propel) °• Decaffeinated coffee or tea (sugar-free) °• Crystal Lite, Wyler’s Lite,  Minute Maid Lite °• Sugar-free Jell-O °• Bouillon or broth °• Sugar-free Popsicle:   *Less than 20 calories each; Limit 1 per day ° °Full Liquids: °Protein Shakes/Drinks + 2 choices per day of other full liquids °• Full liquids must be: °o No More Than 15 grams of Carbs per serving  °o No More Than 3 grams of Fat per serving °• Strained low-fat cream soup (except Cream of Potato or Tomato) °• Non-Fat milk °• Fat-free Lactaid Milk °• Unsweetened Soy Or Unsweetened Almond Milk °• Low Sugar yogurt (Dannon Lite & Fit, Greek yogurt; Oikos Triple Zero; Chobani Simply 100; Yoplait 100 calorie Greek - No Fruit on the Bottom) ° °  °Vitamins   and Minerals  Start 1 day after surgery unless otherwise directed by your surgeon  Chewable Bariatric Specific Multivitamin / Multimineral Supplement with iron (Example: Bariatric Advantage Multi EA)  Chewable Calcium with Vitamin D-3 (Example: 3 Chewable Calcium Plus 600 with Vitamin D-3) o Take 500 mg three (3) times a day for a total of 1500 mg each day o Do not take all 3 doses of calcium at one time as it may cause constipation, and you can only absorb 500 mg  at a time  o Do not mix multivitamins containing iron with calcium supplements; take 2 hours apart  Menstruating women and those with a history of anemia (a blood disease that causes weakness) may need extra iron o Talk with your doctor to see if you need more iron  Do not stop taking or change any vitamins or minerals until you talk to your dietitian or surgeon  Your Dietitian and/or surgeon must approve all vitamin and mineral supplements   Activity and Exercise: Limit your physical activity as instructed by your doctor.  It is important to continue walking at home.  During this time, use these guidelines:  Do not lift anything greater than ten (10) pounds for at least two (2) weeks  Do not go back to work or drive until Engineer, production says you can  You may have sex when you feel comfortable  o It is  VERY important for female patients to use a reliable birth control method; fertility often increases after surgery  o All hormonal birth control will be ineffective for 30 days after surgery due to medications given during surgery a barrier method must be used. o Do not get pregnant for at least 18 months  Start exercising as soon as your doctor tells you that you can o Make sure your doctor approves any physical activity  Start with a simple walking program  Walk 5-15 minutes each day, 7 days per week.   Slowly increase until you are walking 30-45 minutes per day Consider joining our [place] program. [phone] or email belt@[employer] .edu   Special Instructions Things to remember:  Use your CPAP when sleeping if this applies to you   [facility] has two free Bariatric Surgery Support Groups that meet monthly o The 3rd Thursday of each month, 6 pm, [facility]  o The 2nd Friday of each month, 11:45 am in the private dining room in the basement of [facility]  It is very important to keep all follow up appointments with your surgeon, dietitian, primary care physician, and behavioral health practitioner  Routine follow up schedule with your surgeon include appointments at 2-3 weeks, 6-8 weeks, 6 months, and 1 year at a minimum.  Your surgeon may request to see you more often.   o After the first year, please follow up with your bariatric surgeon and dietitian at least once a year in order to maintain best weight loss results [facility]: [place]: [phone] Bariatric Nurse Coordinator: [phone]      Reviewed and Endorsed  by [facility] Patient Education Committee, June, 2016 Edits Approved: Aug, 2018

## 2019-06-17 NOTE — Progress Notes (Signed)
Patient alert and oriented, pain is controlled. Patient is tolerating fluids, advanced to protein shake today, patient is tolerating well. Reviewed Gastric Bypass discharge instructions with patient and patient is able to articulate understanding. Provided information on [facility] program, Support Group and [facility] outpatient pharmacy. All questions answered, will continue to monitor.   Total fluid intake 660 Per dehydration protocol call back one week post op

## 2019-06-22 ENCOUNTER — Telehealth (HOSPITAL_COMMUNITY): Payer: Self-pay

## 2019-06-22 NOTE — Telephone Encounter (Addendum)
Patient called to discuss post bariatric surgery follow up questions, contact information provided for return call.  Await response to discuss below questions:  Call returned 06/22/2019 1219   1.  Tell me about your pain and pain management?denies  2.  Let's talk about fluid intake.  How much total fluid are you taking in? 54 ounces + of fluid  3.  How much protein have you taken in the last 2 days? 60grams  4.  Have you had nausea?  Tell me about when have experienced nausea and what you did to help?denies  5.  Has the frequency or color changed with your urine?  Urine light in color  6.  Tell me what your incisions look like? No problems, starting to peel   7.  Have you been passing gas? BM? Multiple bm's since discharge  8.  If a problem or question were to arise who would you call?  Do you know contact numbers for [place], [facility], and [facility]?aware of how to contact all services  9.  How has the walking going?up walking around no difficulty  10.  How are your vitamins and calcium going?  How are you taking them?take multivitamin and calcium   Had temperature Thursday after discharge of 100 took tylenol once no temperature since that time.  Seeing PCP today at 345, to discuss BP meds. Reminded to see [facility] for follow up

## 2019-06-30 ENCOUNTER — Encounter: Payer: 59 | Admitting: Skilled Nursing Facility1

## 2019-06-30 ENCOUNTER — Other Ambulatory Visit: Payer: Self-pay

## 2019-06-30 DIAGNOSIS — E669 Obesity, unspecified: Secondary | ICD-10-CM

## 2019-06-30 NOTE — Progress Notes (Signed)
2 Week Post-Operative Nutrition Class   Patient was seen on 02/25/18 for Post-Operative Nutrition education at the [facility].    Surgery date: 06/15/2019 Surgery type: RYGB Start weight at [facility]: 277.7 Weight today: 257.3   Body Composition Scale 06/30/2019  Total Body Fat % 46.8  Visceral Fat 18  Fat-Free Mass % 53.1   Total Body Water % 41   Muscle-Mass lbs 30.8  Body Fat Displacement          Torso  lbs 74.7         Left Leg  lbs 14.9         Right Leg  lbs 14.9         Left Arm  lbs 7.4         Right Arm   lbs 7.4     The following the learning objectives were met by the patient during this course:  Identifies Phase 3 (Soft, High Proteins) Dietary Goals and will begin from 2 weeks post-operatively to 2 months post-operatively  Identifies appropriate sources of fluids and proteins   States protein recommendations and appropriate sources post-operatively  Identifies the need for appropriate texture modifications, mastication, and bite sizes when consuming solids  Identifies appropriate multivitamin and calcium sources post-operatively  Describes the need for physical activity post-operatively and will follow MD recommendations  States when to call healthcare provider regarding medication questions or post-operative complications   Handouts given during class include:  Phase 3A: Soft, High Protein Diet Handout   Follow-Up Plan: Patient will follow-up at [facility] in 6 weeks for 2 month post-op nutrition visit for diet advancement per MD.

## 2019-07-01 ENCOUNTER — Telehealth: Payer: Self-pay | Admitting: Skilled Nursing Facility1

## 2019-07-01 NOTE — Telephone Encounter (Signed)
Returned pts call.  Pt called asking if she could have sugar free pudding   Dietitian advised: no

## 2019-07-07 ENCOUNTER — Telehealth: Payer: Self-pay | Admitting: Skilled Nursing Facility1

## 2019-07-07 NOTE — Telephone Encounter (Signed)
RD called pt to verify fluid intake once starting soft, solid proteins 2 week post-bariatric surgery.   Daily Fluid intake: 64 Daily Protein intake: 60  Concerns/issues:   Pt states she has tried different foods and likes cottage cheese now.

## 2019-07-15 ENCOUNTER — Telehealth: Payer: Self-pay | Admitting: Skilled Nursing Facility1

## 2019-07-15 NOTE — Telephone Encounter (Signed)
Returned pts call.  Pt states her girlfriend told her could have some foods so she wanted to run them by the dietitian.

## 2019-08-11 ENCOUNTER — Ambulatory Visit: Payer: 59 | Admitting: Dietician

## 2019-08-12 ENCOUNTER — Other Ambulatory Visit: Payer: Self-pay

## 2019-08-12 ENCOUNTER — Encounter: Payer: 59 | Attending: Surgery | Admitting: Skilled Nursing Facility1

## 2019-08-12 DIAGNOSIS — E669 Obesity, unspecified: Secondary | ICD-10-CM | POA: Diagnosis present

## 2019-08-12 NOTE — Progress Notes (Signed)
Bariatric Nutrition Follow-Up Visit Medical Nutrition Therapy   NUTRITION ASSESSMENT    Anthropometrics  Surgery date: 06/15/2019 Surgery type: RYGB Start weight at [facility]: 277.7 Weight today: 236.6   Body Composition Scale 06/30/2019 08/12/2019  Total Body Fat % 46.8 44.8  Visceral Fat 18 16  Fat-Free Mass % 53.1 55.1   Total Body Water % 41 42   Muscle-Mass lbs 30.8 30.6  Body Fat Displacement           Torso  lbs 74.7 65.8         Left Leg  lbs 14.9 13.1         Right Leg  lbs 14.9 13.1         Left Arm  lbs 7.4 6.5         Right Arm   lbs 7.4 6.5    Clinical  Medical hx: HTN, prediabetes Medications: off some blood pressure medicines still taking one Labs: A1C 6.0, hemoglobin 11.5, MCV 79.4, MCH 25.2   Lifestyle & Dietary Hx  Pt states she logs everything she eats.  Pt states she likes the impossible burgers.  Pt state she cannot tolerate beef well.   Estimated daily fluid intake: unknown oz Estimated daily protein intake: 60+ g Supplements: multi and calcium Current average weekly physical activity: arm weights and walking   24-Hr Dietary Recall First Meal: plant bacon + cottage cheese Snack: half protein shake Second Meal: salmon and shrimp with parmasean cheese Snack:  Third Meal: salmon and shrimp with parmasean cheese Snack:  Beverages: decaf coffee + sugar free creamer, sugar free juice, water  Post-Op Goals/ Signs/ Symptoms Using straws: no Drinking while eating: no Chewing/swallowing difficulties: no Changes in vision: no Changes to mood/headaches: no Hair loss/changes to skin/nails: no Difficulty focusing/concentrating: no Sweating: no Dizziness/lightheadedness: no Palpitations: no  Carbonated/caffeinated beverages: no N/V/D/C/Gas: using miralax sometimes  Abdominal pain: no Dumping syndrome: no    NUTRITION DIAGNOSIS  Overweight/obesity ([place]-3.3) related to past poor dietary habits and physical inactivity as evidenced by completed  bariatric surgery and following dietary guidelines for continued weight loss and healthy nutrition status.     NUTRITION INTERVENTION Nutrition counseling (C-1) and education (E-2) to facilitate bariatric surgery goals, including: . Diet advancement to the next phase (phase 4) now including non starchy vegetables . The importance of consuming adequate calories as well as certain nutrients daily due to the body's need for essential vitamins, minerals, and fats . The importance of daily physical activity and to reach a goal of at least 150 minutes of moderate to vigorous physical activity weekly (or as directed by their physician) due to benefits such as increased musculature and improved lab values . The importance of intuitive eating specifically learning hunger-satiety cues and understanding the importance of learning a new body  Goals: -Continue to aim for a minimum of 64 fluid ounces 7 days a week with at least 30 ounces being plain water -Eat non-starchy vegetables 2 times a day 7 days a week -Start out with soft cooked vegetables today and tomorrow; if tolerated begin to eat raw vegetables or cooked including salads -Eat your 3 ounces of protein first then start in on your non-starchy vegetables; once you understand how much of your meal leads to satisfaction and not full while still eating 3 ounces of protein and non-starchy vegetables you can eat them in any order  -Continue to aim for 30 minutes of activity at least 5 times a week -Do NOT cook with/add to  your food: alfredo sauce, cheese sauce, barbeque sauce, ketchup, fat back, butter, bacon grease, grease, Crisco, OR SUGAR  Handouts Provided Include   Phase 4  Learning Style & Readiness for Change Teaching method utilized: Visual & Auditory  Demonstrated degree of understanding via: Teach Back  Barriers to learning/adherence to lifestyle change: none identified   RD's Notes for Next Visit . Assess adherence to pt chosen  goals   MONITORING & EVALUATION Dietary intake, weekly physical activity, body weight  Next Steps Patient is to follow-up in 3 months

## 2019-08-17 ENCOUNTER — Other Ambulatory Visit: Payer: Self-pay | Admitting: Podiatry

## 2019-08-22 ENCOUNTER — Other Ambulatory Visit: Payer: Self-pay | Admitting: Podiatry

## 2019-09-02 ENCOUNTER — Ambulatory Visit: Payer: 59 | Admitting: Dietician

## 2019-09-24 ENCOUNTER — Other Ambulatory Visit: Payer: Self-pay | Admitting: Podiatry

## 2019-09-26 NOTE — Telephone Encounter (Signed)
Please advise 

## 2019-09-28 NOTE — Telephone Encounter (Signed)
She can have a refill if it is helping but will need to follow up with her family doc for continued prescription

## 2019-10-09 ENCOUNTER — Telehealth: Payer: Self-pay | Admitting: Adult Health

## 2019-10-09 NOTE — Telephone Encounter (Signed)
..   Pt understands that although there may be some limitations with this type of visit, we will take all precautions to reduce any security or privacy concerns.  Pt understands that this will be treated like an in office visit and we will file with pt's insurance, and there may be a patient responsible charge related to this service. ? ?

## 2019-10-12 NOTE — Telephone Encounter (Signed)
Noted cpap.

## 2019-11-04 ENCOUNTER — Telehealth (INDEPENDENT_AMBULATORY_CARE_PROVIDER_SITE_OTHER): Payer: 59 | Admitting: Adult Health

## 2019-11-04 DIAGNOSIS — G4733 Obstructive sleep apnea (adult) (pediatric): Secondary | ICD-10-CM | POA: Diagnosis not present

## 2019-11-04 DIAGNOSIS — Z9989 Dependence on other enabling machines and devices: Secondary | ICD-10-CM

## 2019-11-04 NOTE — Progress Notes (Signed)
PATIENT: [name] DOB: [birth year]  REASON FOR VISIT: follow up HISTORY FROM: patient  Virtual Visit via Video Note  I connected with [name] on 11/04/19 at  9:30 AM EDT by a video enabled telemedicine application located remotely at [facility] and verified that I am speaking with the correct person using two identifiers who was located at their own home.   I discussed the limitations of evaluation and management by telemedicine and the availability of in person appointments. The patient expressed understanding and agreed to proceed.   PATIENT: [name] DOB: 04/26/1965  REASON FOR VISIT: follow up HISTORY FROM: patient  HISTORY OF PRESENT ILLNESS: Today 11/04/19:  Ms. Saal is a 54 year old female with a history of obstructive sleep apnea on CPAP.  Her download indicates that she use her machine 29 out of 30 days for compliance of 97%.  She use her machine greater than 4 hours 28 days for compliance of 93%.  On average she uses her machine 7 hours and 7 minutes.  Her residual AHI is 0.3 on 5 to 18 cm of water with EPR 2.  Leak in the 95th percentile is 2.4 L/min she reports that the CPAP is working well for her.  She does state that she had gastric bypass surgery in June and has lost approximately 80 pounds.  HISTORY 04/29/19:  Ms. Timberlake is a 54 year old female with a history of obstructive sleep apnea on CPAP.  Her download indicates that she use her machine 26 out of 30 days for compliance of 87%.  She use her machine greater than 4 hours each night.  On average she uses her machine 7 hours and 36 minutes.  Her residual AHI is 1.4 on 5 to 18 cm of water with EPR 2.  She does not have a significant leak.  She reports that she feels that she is sleeping better at night.  She has not noticed much difference during the day.  REVIEW OF SYSTEMS: Out of a complete 14 system review of symptoms, the patient complains only of the following  symptoms, and all other reviewed systems are negative.  ESS 1  ALLERGIES: Allergies  Allergen Reactions  . Contrast Media [Iodinated Diagnostic Agents] Nausea And Vomiting    Pt began vomiting after multihance injection     HOME MEDICATIONS: Outpatient Medications Prior to Visit  Medication Sig Dispense Refill  . acidophilus (RISAQUAD) CAPS capsule Take 1 capsule by mouth daily.    Marland Kitchen allopurinol (ZYLOPRIM) 100 MG tablet Take 1 tablet by mouth once daily 30 tablet 0  . amLODipine (NORVASC) 10 MG tablet Take 10 mg by mouth at bedtime.    . Ascorbic Acid (VITAMIN C PO) Take 2 tablets by mouth daily.     . Azilsartan-Chlorthalidone (EDARBYCLOR) 40-12.5 MG TABS Take 1 tablet by mouth daily.     . Calcium 500-100 MG-UNIT CHEW Chew 2 tablets by mouth daily.     . Cholecalciferol (VITAMIN D3 GUMMIES ADULT PO) Take 2 tablets by mouth daily.    . Cyanocobalamin ([employer] B12 GUMMIES PO) Take 2 tablets by mouth daily.    . indapamide (LOZOL) 1.25 MG tablet Take 1.25 mg by mouth every morning.    . Multiple Vitamin (MULTIVITAMIN WITH MINERALS) TABS tablet Take 1 tablet by mouth daily.    Marland Kitchen oxyCODONE (OXY IR/ROXICODONE) 5 MG immediate release tablet Take 1 tablet (5 mg total) by mouth every 6 (six) hours as needed for severe pain.  10 tablet 0   No facility-administered medications prior to visit.    PAST MEDICAL HISTORY: Past Medical History:  Diagnosis Date  . Anemia   . Constipation   . Gallbladder problem   . History of acute pancreatitis   . Hypertension   . Left shoulder pain   . Low back pain   . Sleep apnea    CPAP    PAST SURGICAL HISTORY: Past Surgical History:  Procedure Laterality Date  . ABDOMINAL HYSTERECTOMY    . ABDOMINAL HYSTERECTOMY    . CHOLECYSTECTOMY    . GASTRIC ROUX-EN-Y N/A 06/15/2019   Procedure: LAPAROSCOPIC ROUX-EN-Y GASTRIC BYPASS WITH UPPER ENDOSCOPY, ERAS Pathway;  Surgeon: Alphonsa Overall, MD;  Location: [facility];  Service: General;  Laterality: N/A;  .  PANCREAS SURGERY      FAMILY HISTORY: Family History  Problem Relation Age of Onset  . Hypertension Mother   . Hypertension Father   . Cancer Other     SOCIAL HISTORY: Social History   Socioeconomic History  . Marital status: Divorced    Spouse name: Not on file  . Number of children: Not on file  . Years of education: Not on file  . Highest education level: Not on file  Occupational History  . Occupation: Glass blower/designer: [employer]  Tobacco Use  . Smoking status: Former Smoker    Quit date: 01/01/1997    Years since quitting: 22.8  . Smokeless tobacco: Never Used  Vaping Use  . Vaping Use: Never used  Substance and Sexual Activity  . Alcohol use: Not Currently    Comment: occ  . Drug use: Never  . Sexual activity: Not on file  Other Topics Concern  . Not on file  Social History Narrative  . Not on file   Social Determinants of Health   Financial Resource Strain:   . Difficulty of Paying Living Expenses: Not on file  Food Insecurity:   . Worried About Charity fundraiser in the Last Year: Not on file  . Ran Out of Food in the Last Year: Not on file  Transportation Needs:   . Lack of Transportation (Medical): Not on file  . Lack of Transportation (Non-Medical): Not on file  Physical Activity:   . Days of Exercise per Week: Not on file  . Minutes of Exercise per Session: Not on file  Stress:   . Feeling of Stress : Not on file  Social Connections:   . Frequency of Communication with Friends and Family: Not on file  . Frequency of Social Gatherings with Friends and Family: Not on file  . Attends Religious Services: Not on file  . Active Member of Clubs or Organizations: Not on file  . Attends Archivist Meetings: Not on file  . Marital Status: Not on file  Intimate Partner Violence:   . Fear of Current or Ex-Partner: Not on file  . Emotionally Abused: Not on file  . Physically Abused: Not on file  . Sexually  Abused: Not on file      PHYSICAL EXAM Generalized: Well developed, in no acute distress   Neurological examination  Mentation: Alert oriented to time, place, history taking. Follows all commands speech and language fluent Cranial nerve II-XII:Extraocular movements were full. Facial symmetry noted. uvula tongue midline. Head turning and shoulder shrug  were normal and symmetric. Motor: Good strength throughout subjectively per patient Sensory: Sensory testing is intact to soft touch on all 4  extremities subjectively per patient Coordination: Cerebellar testing reveals good finger-nose-finger  Gait and station: Patient is able to stand from a seated position. gait is normal.  Reflexes: UTA  DIAGNOSTIC DATA (LABS, IMAGING, TESTING) - I reviewed patient records, labs, notes, testing and imaging myself where available.  Lab Results  Component Value Date   WBC 8.8 06/17/2019   HGB 11.5 (L) 06/17/2019   HCT 36.3 06/17/2019   MCV 79.4 (L) 06/17/2019   PLT 187 06/17/2019      Component Value Date/Time   NA 140 06/11/2019 0952   NA 141 06/13/2017 1001   K 3.4 (L) 06/11/2019 0952   CL 99 06/11/2019 0952   CO2 30 06/11/2019 0952   GLUCOSE 94 06/11/2019 0952   BUN 17 06/11/2019 0952   BUN 13 06/13/2017 1001   CREATININE 0.83 06/11/2019 0952   CALCIUM 9.2 06/11/2019 0952   CALCIUM 9.1 08/01/2010 2213   PROT 7.7 06/11/2019 0952   PROT 7.4 06/13/2017 1001   ALBUMIN 4.2 06/11/2019 0952   ALBUMIN 4.2 06/13/2017 1001   AST 19 06/11/2019 0952   ALT 21 06/11/2019 0952   ALKPHOS 92 06/11/2019 0952   BILITOT 1.0 06/11/2019 0952   BILITOT 0.5 06/13/2017 1001   GFRNONAA >60 06/11/2019 0952   GFRAA >60 06/11/2019 0952   Lab Results  Component Value Date   CHOL 169 01/23/2017   HDL 62 01/23/2017   LDLCALC 97 01/23/2017   TRIG 48 01/23/2017   CHOLHDL 3.1 08/01/2010   Lab Results  Component Value Date   HGBA1C 6.0 (H) 06/11/2019   Lab Results  Component Value Date   VITAMINB12  511 01/23/2017   Lab Results  Component Value Date   TSH 3.320 01/23/2017      ASSESSMENT AND PLAN 54 y.o. year old female  has a past medical history of Anemia, Constipation, Gallbladder problem, History of acute pancreatitis, Hypertension, Left shoulder pain, Low back pain, and Sleep apnea. here with:  OSA on CPAP  . CPAP compliance excellent . Residual AHI is good . Encouraged patient to continue using CPAP nightly and > 4 hours each night . We will repeat home sleep study due to 80 pound weight loss . F/U in 1 year or sooner if needed  I spent 20 minutes of face-to-face and non-face-to-face time with patient.  This included previsit chart review, lab review, study review, order entry, electronic health record documentation, patient education.  Ward Givens, MSN, NP-C 11/04/2019, 9:14 AM [facility] [address] [place], [place] [address] [phone]

## 2019-11-11 ENCOUNTER — Telehealth: Payer: Self-pay

## 2019-11-11 ENCOUNTER — Ambulatory Visit: Payer: 59 | Admitting: Skilled Nursing Facility1

## 2019-11-11 NOTE — Telephone Encounter (Signed)
LVM for pt to call me back to schedule sleep study  

## 2019-11-12 ENCOUNTER — Encounter: Payer: 59 | Attending: Surgery | Admitting: Skilled Nursing Facility1

## 2019-11-12 ENCOUNTER — Other Ambulatory Visit: Payer: Self-pay

## 2019-11-12 DIAGNOSIS — E669 Obesity, unspecified: Secondary | ICD-10-CM

## 2019-11-12 NOTE — Progress Notes (Signed)
Bariatric Nutrition Follow-Up Visit Medical Nutrition Therapy   NUTRITION ASSESSMENT    Anthropometrics  Surgery date: 06/15/2019 Surgery type: RYGB Start weight at [facility]: 277.7 Weight today: 207.6   Body Composition Scale 06/30/2019 08/12/2019 11/12/2019  Total Body Fat % 46.8 44.8 41.9  Visceral Fat 18 16 14   Fat-Free Mass % 53.1 55.1 58   Total Body Water % 41 42 43.5   Muscle-Mass lbs 30.8 30.6 29.9  Body Fat Displacement            Torso  lbs 74.7 65.8 53.8         Left Leg  lbs 14.9 13.1 10.7         Right Leg  lbs 14.9 13.1 10.7         Left Arm  lbs 7.4 6.5 5.3         Right Arm   lbs 7.4 6.5 5.3    Clinical  Medical hx: HTN, prediabetes Medications: off some blood pressure medicines still taking one Labs: A1C 6.0, hemoglobin 11.5, MCV 79.4, MCH 25.2   Lifestyle & Dietary Hx  Pt states she logs everything she eats.  Pt states she likes the impossible burgers.  Pt state she cannot tolerate beef well.   Pt states he will stop when full and finish the meal a couple hours later. Pt states her mother lives with her now.   Estimated daily fluid intake: unknown oz Estimated daily protein intake: 60+ g Supplements: multi and calcium and vitamin c Current average weekly physical activity: arm weights and walking   24-Hr Dietary Recall First Meal: plant bacon + cottage cheese or omelette with peppers cheese  Snack: half protein shake or boiled egg Second Meal: salmon and shrimp with parmasean cheese or eggplant + salad Snack: yogurt Third Meal: salmon and shrimp with parmasean cheese or crab salad + greens Snack: sometimes protein shake Beverages: decaf coffee + sugar free creamer, sugar free juice + gatorade zero, water, diet tea  Post-Op Goals/ Signs/ Symptoms Using straws: no Drinking while eating: no Chewing/swallowing difficulties: no Changes in vision: no Changes to mood/headaches: no Hair loss/changes to skin/nails: no Difficulty focusing/concentrating:  no Sweating: no Dizziness/lightheadedness: no Palpitations: no  Carbonated/caffeinated beverages: no N/V/D/C/Gas: no Abdominal pain: no Dumping syndrome: no    NUTRITION DIAGNOSIS  Overweight/obesity ([place]-3.3) related to past poor dietary habits and physical inactivity as evidenced by completed bariatric surgery and following dietary guidelines for continued weight loss and healthy nutrition status.     NUTRITION INTERVENTION Nutrition counseling (C-1) and education (E-2) to facilitate bariatric surgery goals, including: . Diet advancement to the next phase (phase 4) now including starchy vegetables . The importance of consuming adequate calories as well as certain nutrients daily due to the body's need for essential vitamins, minerals, and fats . The importance of daily physical activity and to reach a goal of at least 150 minutes of moderate to vigorous physical activity weekly (or as directed by their physician) due to benefits such as increased musculature and improved lab values . The importance of intuitive eating specifically learning hunger-satiety cues and understanding the importance of learning a new body Importance of vegetables . To have an overall healthy diet, adult men and women are recommended to consume anywhere from 2-3 cups of vegetables daily. Vegetables provide a wide range of vitamins and minerals such as vitamin A, vitamin C, potassium, and folic acid. According to the [facility], including fruit and vegetables daily may reduce the risk  of cardiovascular disease, certain cancers, and other non-communicable diseases.  Goals: -Continue to aim for a minimum of 64 fluid ounces 7 days a week with at least 30 ounces being plain water -Eat non-starchy vegetables 2 times a day 7 days a week -Start out with soft cooked vegetables today and tomorrow; if tolerated begin to eat raw vegetables or cooked including salads -Eat your 3 ounces of protein first then  start in on your non-starchy vegetables; once you understand how much of your meal leads to satisfaction and not full while still eating 3 ounces of protein and non-starchy vegetables you can eat them in any order  -Continue to aim for 30 minutes of activity at least 5 times a week -Do NOT cook with/add to your food: alfredo sauce, cheese sauce, barbeque sauce, ketchup, fat back, butter, bacon grease, grease, Crisco, OR SUGAR -Add in starchy vegetables throughout the week  Handouts Provided Include   Phase 5  Learning Style & Readiness for Change Teaching method utilized: Visual & Auditory  Demonstrated degree of understanding via: Teach Back  Barriers to learning/adherence to lifestyle change: none identified   RD's Notes for Next Visit . Assess adherence to pt chosen goals   MONITORING & EVALUATION Dietary intake, weekly physical activity, body weight  Next Steps Patient is to follow-up in 3 months

## 2019-11-30 ENCOUNTER — Telehealth: Payer: Self-pay | Admitting: Skilled Nursing Facility1

## 2019-11-30 NOTE — Telephone Encounter (Signed)
Dietitian returned pts call.  Dietitian answered pts questions.

## 2019-12-01 ENCOUNTER — Telehealth: Payer: Self-pay | Admitting: Skilled Nursing Facility1

## 2019-12-01 ENCOUNTER — Other Ambulatory Visit: Payer: Self-pay | Admitting: Podiatry

## 2019-12-01 NOTE — Telephone Encounter (Signed)
Returned pts call  Pt asked if she could have nuts: Dietitian advised she stick with 1/4 cup per day

## 2019-12-14 ENCOUNTER — Ambulatory Visit (INDEPENDENT_AMBULATORY_CARE_PROVIDER_SITE_OTHER): Payer: 59 | Admitting: Neurology

## 2019-12-14 DIAGNOSIS — G4733 Obstructive sleep apnea (adult) (pediatric): Secondary | ICD-10-CM

## 2019-12-14 DIAGNOSIS — E662 Morbid (severe) obesity with alveolar hypoventilation: Secondary | ICD-10-CM

## 2019-12-14 DIAGNOSIS — Z9989 Dependence on other enabling machines and devices: Secondary | ICD-10-CM

## 2019-12-15 NOTE — Progress Notes (Signed)
   [facility]  HOME SLEEP TEST (Watch PAT)  STUDY DATE: 12/14/19  DOB: 1965-12-16  MRN: [id number]  ORDERING CLINICIAN: Ward Givens, NP    REFERRING CLINICIAN: Lucianne Lei, MD   CLINICAL INFORMATION/HISTORY: [name] is a 54 year old female with a history of obstructive sleep apnea on CPAP. She underwent Roux and Y surgery with Dr. Lucia Gaskins on 07-10-2019.  Her download indicates that she use her machine 29 out of 30 days for compliance of 97%.  She use her machine greater than 4 hours 28 days for compliance of 93%.  On average she uses her machine 7 hours and 7 minutes.  Her residual AHI is 0.3/h on 5 to 18 cm of water with EPR 2.  Leak in the 95th percentile is 2.4 L/min she reports that the CPAP is working well for her.    She does state that she had gastric bypass surgery in June and has lost approximately 80 pounds.  Epworth sleepiness score: 1/24.  Neck Circumference: 17"  BMI: 46.7 kg/m  FINDINGS:   Total Record Time (hours, min): 9 h 10 min  Total Sleep Time (hours, min):  7 h 55 min   Percent REM (%):    30.02 %   Calculated pAHI (per hour):  15.1      REM pAHI:    33.7     NREM pAHI: 7.4 Supine AHI: N/A   Oxygen Saturation (%) Mean: 93  Minimum oxygen saturation (%):        71   O2 Saturation Range (%): 75-98  O2Saturation (minutes) <=88%: 6.2 min   Pulse Mean (bpm):    58  Pulse Range (38-91)   IMPRESSION: This HST indicated the presence of only mild OSA (obstructive sleep apnea) at an AHI of 15/h. with a strong REM sleep dependency.  SpO2 at nadir was 71% but only 6.2 min of desaturation were noted, clinically no longer significant.     RECOMMENDATION:  I recommend to continue CPAP use under the current settings until the AHI may reach 10 or less/h. The weight loss has certainly reduced the risk of obesity hypoventilation. Retesting in 6 month is advised.      INTERPRETING PHYSICIAN:  Larey Seat, MD [facility] [address] [place], [place] [address] [phone]

## 2019-12-18 ENCOUNTER — Telehealth: Payer: Self-pay | Admitting: Neurology

## 2019-12-18 DIAGNOSIS — G4733 Obstructive sleep apnea (adult) (pediatric): Secondary | ICD-10-CM | POA: Insufficient documentation

## 2019-12-18 NOTE — Telephone Encounter (Signed)
HOME SLEEP TEST (Watch PAT)  STUDY DATE: 12/14/19  DOB: [birth year]  MRN: [id number]  ORDERING CLINICIAN: Ward Givens, NP    REFERRING CLINICIAN: Lucianne Lei, MD   CLINICAL INFORMATION/HISTORY: Ms. Quackenbush is a 54 year old female with a history of obstructive sleep apnea on CPAP. She underwent Roux and Y surgery with Dr. Lucia Gaskins on 07-10-2019.  Her download indicates that she use her machine 29 out of 30 days for compliance of 97%.  She use her machine greater than 4 hours 28 days for compliance of 93%.  On average she uses her machine 7 hours and 7 minutes.  Her residual AHI is 0.3/h on 5 to 18 cm of water with EPR 2.  Leak in the 95th percentile is 2.4 L/min she reports that the CPAP is working well for her.    She does state that she had gastric bypass surgery in June and has lost approximately 80 pounds.  Epworth sleepiness score: 1/24.  Neck Circumference: 17"  BMI: 46.7 kg/m  FINDINGS:   Total Record Time (hours, min): 9 h 10 min  Total Sleep Time (hours, min):  7 h 55 min   Percent REM (%):    30.02 %   Calculated pAHI (per hour):  15.1      REM pAHI:    33.7     NREM pAHI: 7.4 Supine AHI: N/A   Oxygen Saturation (%) Mean: 93  Minimum oxygen saturation (%):        71   O2 Saturation Range (%): 75-98  O2Saturation (minutes) <=88%: 6.2 min   Pulse Mean (bpm):    58  Pulse Range (38-91)   IMPRESSION: This HST indicated the presence of only mild OSA (obstructive sleep apnea) at an AHI of 15/h. with a strong REM sleep dependency.  SpO2 at nadir was 71% but only 6.2 min of desaturation were noted, clinically no longer significant.     RECOMMENDATION:  I recommend to continue CPAP use under the current settings until the AHI may reach 10 or less/h. The achieved weight loss has certainly reduced the degree of apnea and risk of obesity hypoventilation. Retesting in 6 month is advised.      INTERPRETING PHYSICIAN:  Larey Seat, MD [facility] [address] [place], [place] [address] [phone]

## 2019-12-18 NOTE — Procedures (Signed)
HOME SLEEP TEST (Watch PAT)  STUDY DATE: 12/14/19  DOB: 1965/06/02  MRN: [id number]  ORDERING CLINICIAN: Ward Givens, NP    REFERRING CLINICIAN: Lucianne Lei, MD   CLINICAL INFORMATION/HISTORY: Ms. [name] is a 54 year old female with a history of obstructive sleep apnea on CPAP. She underwent Roux and Y surgery with Dr. Lucia Gaskins on 07-10-2019.  Her download indicates that she use her machine 29 out of 30 days for compliance of 97%.  She use her machine greater than 4 hours 28 days for compliance of 93%.  On average she uses her machine 7 hours and 7 minutes.  Her residual AHI is 0.3/h on 5 to 18 cm of water with EPR 2.  Leak in the 95th percentile is 2.4 L/min she reports that the CPAP is working well for her.    She does state that she had gastric bypass surgery in June and has lost approximately 80 pounds.  Epworth sleepiness score: 1/24.  Neck Circumference: 17"  BMI: 46.7 kg/m  FINDINGS:   Total Record Time (hours, min): 9 h 10 min  Total Sleep Time (hours, min):  7 h 55 min   Percent REM (%):    30.02 %   Calculated pAHI (per hour):  15.1      REM pAHI:    33.7     NREM pAHI: 7.4 Supine AHI: N/A   Oxygen Saturation (%) Mean: 93  Minimum oxygen saturation (%):        71   O2 Saturation Range (%): 75-98  O2Saturation (minutes) <=88%: 6.2 min   Pulse Mean (bpm):    58  Pulse Range (38-91)   IMPRESSION: This HST indicated the presence of only mild OSA (obstructive sleep apnea) at an AHI of 15/h. with a strong REM sleep dependency.  SpO2 at nadir was 71% but only 6.2 min of desaturation were noted, clinically no longer significant.     RECOMMENDATION:  I recommend to continue CPAP use under the current settings until the AHI may reach 10 or less/h. The weight loss has certainly reduced the risk of obesity hypoventilation. Retesting in 6 month is advised.      INTERPRETING PHYSICIAN:  Larey Seat, MD [facility] [address], [place], [place] [address] [phone]

## 2019-12-21 NOTE — Telephone Encounter (Signed)
Called the pt and reviewed her sleep study results with her. She will continue to work with her CPAP and looks forward to reassessing the apnea.

## 2020-02-02 ENCOUNTER — Encounter: Payer: 59 | Attending: Surgery | Admitting: Skilled Nursing Facility1

## 2020-02-02 ENCOUNTER — Other Ambulatory Visit: Payer: Self-pay

## 2020-02-02 DIAGNOSIS — E669 Obesity, unspecified: Secondary | ICD-10-CM

## 2020-02-02 NOTE — Progress Notes (Signed)
Bariatric Nutrition Follow-Up Visit Medical Nutrition Therapy   NUTRITION ASSESSMENT    Anthropometrics  Surgery date: 06/15/2019 Surgery type: RYGB Start weight at [employer]: 277.7 Weight today: 205.1: body fat: 41.2%, % water: 43.8   Body Composition Scale 06/30/2019 08/12/2019 11/12/2019  Total Body Fat % 46.8 44.8 41.9  Visceral Fat 18 16 14   Fat-Free Mass % 53.1 55.1 58   Total Body Water % 41 42 43.5   Muscle-Mass lbs 30.8 30.6 29.9  Body Fat Displacement            Torso  lbs 74.7 65.8 53.8         Left Leg  lbs 14.9 13.1 10.7         Right Leg  lbs 14.9 13.1 10.7         Left Arm  lbs 7.4 6.5 5.3         Right Arm   lbs 7.4 6.5 5.3    Clinical  Medical hx: HTN, prediabetes Medications: off some blood pressure medicines still taking one: taking every other day Labs: A1C 6.0, hemoglobin 11.5, MCV 79.4, MCH 25.2   Lifestyle & Dietary Hx  Pt states she logs everything she eats: not as often, stating she thinks she should go back to doing more often Dietitian agreed.  Pt states she likes the impossible burgers.  Pt states she cannot tolerate beef well.   Pt states he will stop when full and finish the meal a couple hours later. Pt states her mother lives with her now. Pt states he has eaten bread, peas, potato, corn, etc. Pt states she was really happy when she got down to under 200 stating her only weight goal is to be under 200 pounds. Pt states she will start using her airfryer.   Pt states she is busy all the time. Pt states she is not happy with her weight loss thus far: dietitian advised pt to start logging again to determine where the gain has come from  Estimated daily fluid intake: unknown oz Estimated daily protein intake: 60+ g Supplements: multi and calcium and vitamin c Current average weekly physical activity: arm weights and walking and squats throughout the day (30 a day minimum)  24-Hr Dietary Recall: eating every 2-3 hours; eating out about once a week;  eating nuts throughout the day First Meal: plant bacon + cottage cheese or omelette with peppers cheese or boiled egg + plant bacon Snack: half protein shake or boiled egg Second Meal: salmon and shrimp with parmasean cheese or eggplant + salad or salad: corn, lettuce, eggs, onion, carrots, onions, peppers sometimes chicken Snack: yogurt or other half protein shake Third Meal: salmon and shrimp with parmasean cheese or crab salad + greens or broccoli and sheese chicken with spinach onion peppers mushrooms + lentils and zucchini  Snack: sometimes protein shake or protein chips Beverages: decaf coffee + sugar free creamer, sugar free juice + gatorade zero, water, diet tea  Post-Op Goals/ Signs/ Symptoms Using straws: no Drinking while eating: no Chewing/swallowing difficulties: no Changes in vision: no Changes to mood/headaches: no Hair loss/changes to skin/nails: no Difficulty focusing/concentrating: no Sweating: no Dizziness/lightheadedness: no Palpitations: no  Carbonated/caffeinated beverages: no N/V/D/C/Gas: no Abdominal pain: no Dumping syndrome: no    NUTRITION DIAGNOSIS  Overweight/obesity ([place]-3.3) related to past poor dietary habits and physical inactivity as evidenced by completed bariatric surgery and following dietary guidelines for continued weight loss and healthy nutrition status.     NUTRITION INTERVENTION Nutrition counseling (  C-1) and education (E-2) to facilitate bariatric surgery goals, including: . The importance of consuming adequate calories as well as certain nutrients daily due to the body's need for essential vitamins, minerals, and fats . The importance of daily physical activity and to reach a goal of at least 150 minutes of moderate to vigorous physical activity weekly (or as directed by their physician) due to benefits such as increased musculature and improved lab values . The importance of intuitive eating specifically learning hunger-satiety cues and  understanding the importance of learning a new body Importance of vegetables . To have an overall healthy diet, adult men and women are recommended to consume anywhere from 2-3 cups of vegetables daily. Vegetables provide a wide range of vitamins and minerals such as vitamin A, vitamin C, potassium, and folic acid. According to the [facility] [employer], including fruit and vegetables daily may reduce the risk of cardiovascular disease, certain cancers, and other non-communicable diseases.  Goals: Limit complex carbohydrates to 4 bariatric servings (1/4 cup rather than 1/2 cup) per day choosing complex carbohydrates and avoiding crackers, candy, etc. Log everything you put into your mouth and look for patterns at the end of each week and then the month    Handouts Provided Include     Learning Style & Readiness for Change Teaching method utilized: Visual & Auditory  Demonstrated degree of understanding via: Teach Back  Barriers to learning/adherence to lifestyle change: none identified   RD's Notes for Next Visit . Assess adherence to pt chosen goals   MONITORING & EVALUATION Dietary intake, weekly physical activity, body weight  Next Steps Patient is to follow-up in 3 months

## 2020-05-03 ENCOUNTER — Ambulatory Visit: Payer: 59 | Admitting: Skilled Nursing Facility1

## 2020-05-31 ENCOUNTER — Ambulatory Visit: Payer: 59 | Admitting: Skilled Nursing Facility1

## 2020-06-29 ENCOUNTER — Other Ambulatory Visit: Payer: Self-pay

## 2020-06-29 ENCOUNTER — Encounter: Payer: 59 | Attending: Student | Admitting: Skilled Nursing Facility1

## 2020-06-29 DIAGNOSIS — E669 Obesity, unspecified: Secondary | ICD-10-CM | POA: Insufficient documentation

## 2020-06-29 NOTE — Progress Notes (Signed)
Bariatric Nutrition Follow-Up Visit Medical Nutrition Therapy   NUTRITION ASSESSMENT    Anthropometrics  Surgery date: 06/15/2019 Surgery type: RYGB Start weight at [facility]: 277.7 Weight today: 221: body comp did not save after data loss; 43% fat    Body Composition Scale 06/30/2019 08/12/2019 11/12/2019  Total Body Fat % 46.8 44.8 41.9  Visceral Fat 18 16 14   Fat-Free Mass % 53.1 55.1 58   Total Body Water % 41 42 43.5   Muscle-Mass lbs 30.8 30.6 29.9  Body Fat Displacement            Torso  lbs 74.7 65.8 53.8         Left Leg  lbs 14.9 13.1 10.7         Right Leg  lbs 14.9 13.1 10.7         Left Arm  lbs 7.4 6.5 5.3         Right Arm   lbs 7.4 6.5 5.3    Clinical  Medical hx: HTN, prediabetes Medications: off some blood pressure medicines still taking one: taking every other day Labs: A1C 6.0, hemoglobin 11.5, MCV 79.4, MCH 25.2   Lifestyle & Dietary Hx  Pt states she really wants to get down below 200 and is disappointed she is over 200 pounds.   Estimated daily fluid intake: unknown oz Estimated daily protein intake: 60+ g Supplements: multi and calcium and vitamin c Current average weekly physical activity: arm weights and walking and squats throughout the day (30 a day minimum)  24-Hr Dietary Recall: eating every 2-3 hours; eating out about once a week; eating nuts throughout the day First Meal: plant bacon + cottage cheese or omelette with peppers cheese or boiled egg + plant bacon Snack: half protein shake or boiled egg Second Meal: salmon and shrimp with parmasean cheese or eggplant + salad or salad: corn, lettuce, eggs, onion, carrots, onions, peppers sometimes chicken Snack: yogurt or other half protein shake Third Meal: salmon and shrimp with parmasean cheese or crab salad + greens or broccoli and sheese chicken with spinach onion peppers mushrooms + lentils and zucchini  Snack: sometimes protein shake or protein chips Beverages: decaf coffee + sugar free  creamer, sugar free juice + gatorade zero, water, diet tea  Post-Op Goals/ Signs/ Symptoms Using straws: no Drinking while eating: no Chewing/swallowing difficulties: no Changes in vision: no Changes to mood/headaches: no Hair loss/changes to skin/nails: no Difficulty focusing/concentrating: no Sweating: no Dizziness/lightheadedness: no Palpitations: no  Carbonated/caffeinated beverages: no N/V/D/C/Gas: 3 bowel movements per day Abdominal pain: no Dumping syndrome: no    NUTRITION DIAGNOSIS  Overweight/obesity ([place]-3.3) related to past poor dietary habits and physical inactivity as evidenced by completed bariatric surgery and following dietary guidelines for continued weight loss and healthy nutrition status.     NUTRITION INTERVENTION Nutrition counseling (C-1) and education (E-2) to facilitate bariatric surgery goals, including: Encouraged patient to honor their body's internal hunger and fullness cues.  Throughout the day, check in mentally and rate hunger. Stop eating when satisfied not full regardless of how much food is left on the plate.  Get more if still hungry 20-30 minutes later.  The key is to honor satisfaction so throughout the meal, rate fullness factor and stop when comfortably satisfied not physically full. The key is to honor hunger and fullness without any feelings of guilt or shame.  Pay attention to what the internal cues are, rather than any external factors. This will enhance the confidence you have in  listening to your own body and following those internal cues enabling you to increase how often you eat when you are hungry not out of appetite and stop when you are satisfied not full.  Encouraged pt to continue to eat balanced meals inclusive of non starchy vegetables 2 times a day 7 days a week Encouraged pt to choose lean protein sources: limiting beef, pork, sausage, hotdogs, and lunch meat Encourage pt to choose healthy fats such as plant based limiting animal  fats Encouraged pt to continue to drink a minium 64 fluid ounces with half being plain water to satisfy proper hydration    Goals: Limit complex carbohydrates to 4 bariatric servings (1/4 cup rather than 1/2 cup) per day choosing complex carbohydrates and avoiding crackers, candy, etc. Aim for a minimum of 64 fluid ounces including your diet tea Try almond or soy milk with sugar free syrup or creamer with no sugar Do cottage cheese or sausage + 1 piece of fruit Be aware of grazing habits You do not need to do old diets  Increase the intensity of your workouts, do not compensate with more food Limit smoothie to 8 ounces save the other half for the next day  Handouts Provided Include    Learning Style & Readiness for Change Teaching method utilized: Visual & Auditory  Demonstrated degree of understanding via: Teach Back  Barriers to learning/adherence to lifestyle change: none identified   RD's Notes for Next Visit Assess adherence to pt chosen goals   MONITORING & EVALUATION Dietary intake, weekly physical activity, body weight  Next Steps Patient is to follow-up in 3 months

## 2020-09-20 ENCOUNTER — Ambulatory Visit: Payer: 59 | Admitting: Skilled Nursing Facility1

## 2020-10-20 ENCOUNTER — Ambulatory Visit: Payer: 59 | Admitting: Skilled Nursing Facility1

## 2020-11-22 ENCOUNTER — Other Ambulatory Visit: Payer: Self-pay

## 2020-11-22 ENCOUNTER — Encounter: Payer: 59 | Attending: Student | Admitting: Skilled Nursing Facility1

## 2020-11-22 DIAGNOSIS — E669 Obesity, unspecified: Secondary | ICD-10-CM | POA: Diagnosis not present

## 2020-11-22 NOTE — Progress Notes (Signed)
Bariatric Nutrition Follow-Up Visit Medical Nutrition Therapy   NUTRITION ASSESSMENT    Anthropometrics  Surgery date: 06/15/2019 Surgery type: RYGB Start weight at [facility]: 277.7 Weight today: 222 pounds   Body Composition Scale 06/30/2019 08/12/2019 11/12/2019 11/22/2020  Total Body Fat % 46.8 44.8 41.9 43.6  Visceral Fat 18 16 14 15   Fat-Free Mass % 53.1 55.1 58 56.3   Total Body Water % 41 42 43.5 42.6   Muscle-Mass lbs 30.8 30.6 29.9 30.1  Body Fat Displacement             Torso  lbs 74.7 65.8 53.8 60         Left Leg  lbs 14.9 13.1 10.7 12         Right Leg  lbs 14.9 13.1 10.7 12         Left Arm  lbs 7.4 6.5 5.3 6         Right Arm   lbs 7.4 6.5 5.3 6    Clinical  Medical hx: HTN, prediabetes Medications: off some blood pressure medicines still taking one: taking every other day Labs: none updated in EMR   Lifestyle & Dietary Hx   Pt states her father passed away in 08/14/22 and her mother is in the hospital.  Pt states for a while she was not eating appropriately and not eating throughout the day or drinking enough but has gotten better and back to doing right by her body.  Pt states her goal is to be under 200 pounds and get her breast and back fat done.   Estimated daily fluid intake: 32+ oz Estimated daily protein intake: 60+ g Supplements: multi and calcium and vitamin c Current average weekly physical activity: walking 20 minutes 4 days aweek  24-Hr Dietary Recall:  First Meal: cottage cheese Snack: fruit smoothie Second Meal: salad and [place] sausage or tuna pack Snack: keto reeses cup or fat bombs sometimes [facility] chips Third Meal: chicken broccoli and cheese Snack:  Beverages: decaf coffee + sugar free creamer, sugar free juice + gatorade zero, water, diet tea  Post-Op Goals/ Signs/ Symptoms Using straws: no Drinking while eating: no Chewing/swallowing difficulties: no Changes in vision: no Changes to mood/headaches: no Hair loss/changes to  skin/nails: no Difficulty focusing/concentrating: no Sweating: no Dizziness/lightheadedness: no Palpitations: no  Carbonated/caffeinated beverages: no N/V/D/C/Gas: 3 bowel movements per day Abdominal pain: no Dumping syndrome: no    NUTRITION DIAGNOSIS  Overweight/obesity (-3.3) related to past poor dietary habits and physical inactivity as evidenced by completed bariatric surgery and following dietary guidelines for continued weight loss and healthy nutrition status.     NUTRITION INTERVENTION Nutrition counseling (C-1) and education (E-2) to facilitate bariatric surgery goals, including: Encouraged patient to honor their body's internal hunger and fullness cues.  Throughout the day, check in mentally and rate hunger. Stop eating when satisfied not full regardless of how much food is left on the plate.  Get more if still hungry 20-30 minutes later.  The key is to honor satisfaction so throughout the meal, rate fullness factor and stop when comfortably satisfied not physically full. The key is to honor hunger and fullness without any feelings of guilt or shame.  Pay attention to what the internal cues are, rather than any external factors. This will enhance the confidence you have in listening to your own body and following those internal cues enabling you to increase how often you eat when you are hungry not out of appetite and stop when you  are satisfied not full.  Encouraged pt to continue to eat balanced meals inclusive of non starchy vegetables 2 times a day 7 days a week Encouraged pt to choose lean protein sources: limiting beef, pork, sausage, hotdogs, and lunch meat Encourage pt to choose healthy fats such as plant based limiting animal fats Encouraged pt to continue to drink a minium 64 fluid ounces with half being plain water to satisfy proper hydration  Encouraged to always have a variety in foods Avoidance of keto products due to fat content    Handouts Provided Include     Learning Style & Readiness for Change Teaching method utilized: Visual & Auditory  Demonstrated degree of understanding via: Teach Back  Barriers to learning/adherence to lifestyle change: none identified   RD's Notes for Next Visit Assess adherence to pt chosen goals   MONITORING & EVALUATION Dietary intake, weekly physical activity, body weight  Next Steps Patient is to follow-up in 3 months

## 2021-02-13 ENCOUNTER — Ambulatory Visit: Payer: 59 | Admitting: Skilled Nursing Facility1

## 2021-02-28 ENCOUNTER — Encounter: Payer: 59 | Admitting: Skilled Nursing Facility1

## 2022-01-02 IMAGING — RF DG UGI W SINGLE CM
10 of 11 series · 14 of 17 positions shown · non-contrast
Comparison: None.

CLINICAL DATA: Preop for gastric bypass surgery.

EXAM:
UPPER GI SERIES WITH KUB
TECHNIQUE: After obtaining a scout radiograph a routine upper GI series was
performed using thin barium
FLUOROSCOPY TIME:  Fluoroscopy Time:  3 minutes and 42 seconds
Radiation Exposure Index (if provided by the fluoroscopic device):
163.9 mGy
Number of Acquired Spot Images: 0

[Series 1: t abdomen supine · 0.15mm/px · 1 of 1 slices shown (1 of 2)]
[im 1/1]
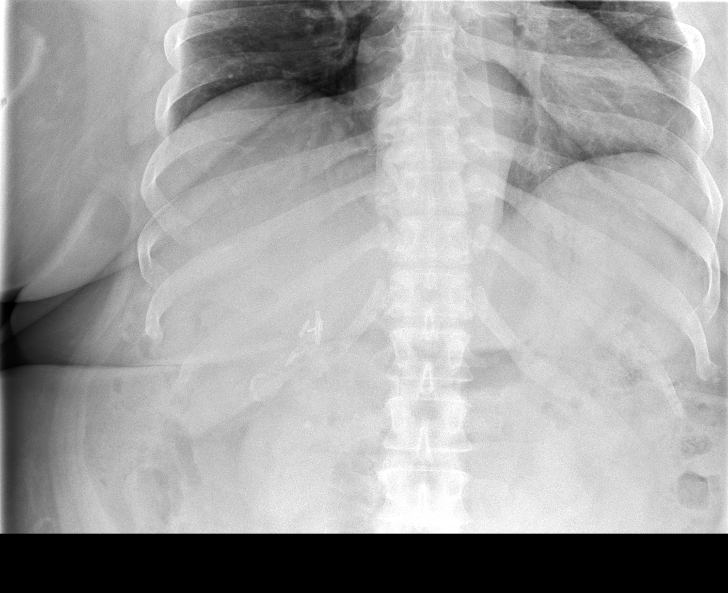

[Series 2: t abdomen supine · 0.15mm/px · 1 of 1 slices shown (2 of 2)]
[im 1/1]
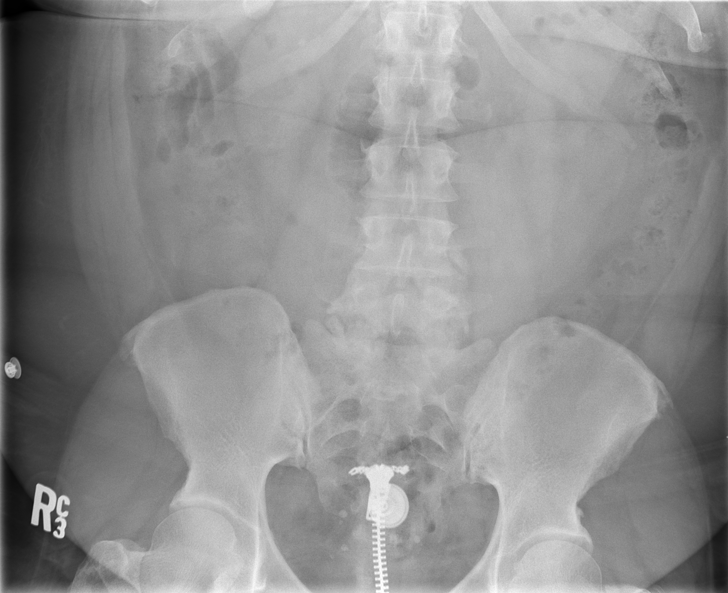

[Series 3: cp_standard · 0.52mm/px · 3 of 131 frames shown (1 of 8)]
[frame 64/131]
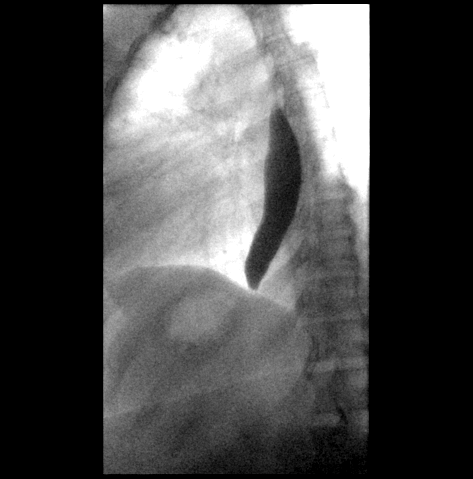
[frame 66/131]
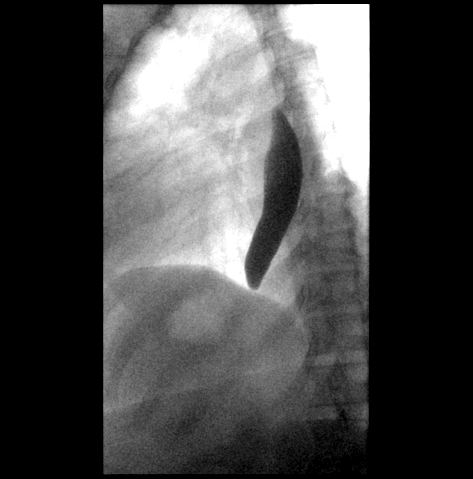
[frame 112/131]
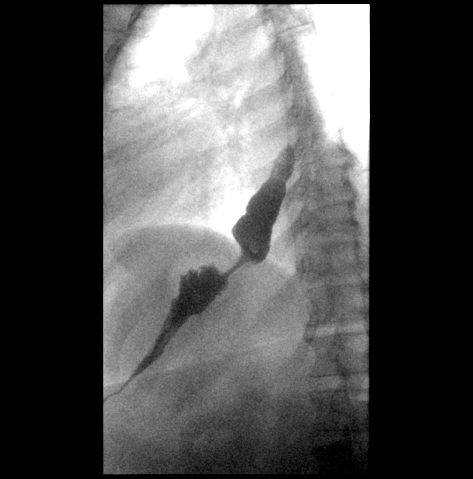

[Series 4: cp_standard · 0.53mm/px · 3 of 221 frames shown (2 of 8)]
[frame 9/221]
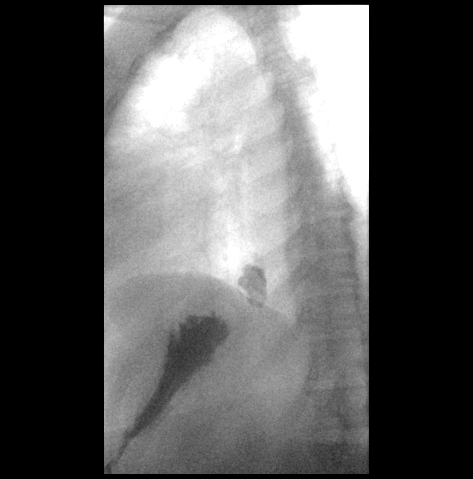
[frame 34/221]
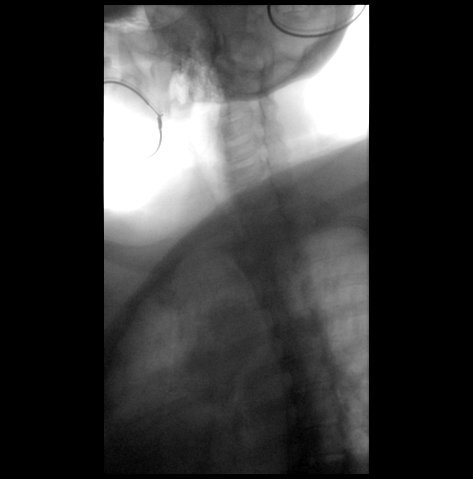
[frame 188/221]
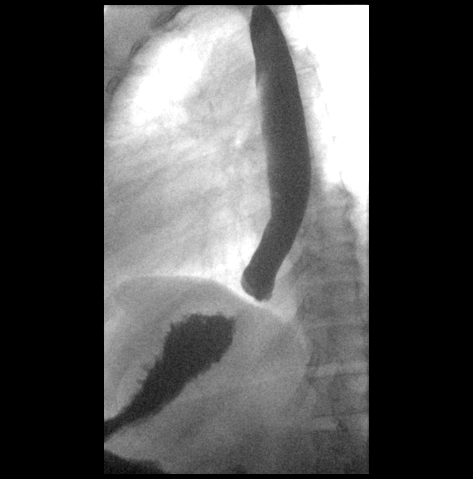

[Series 5: cp_standard · 0.27mm/px · 1 of 1 slices shown (3 of 8)]
[im 1/1]
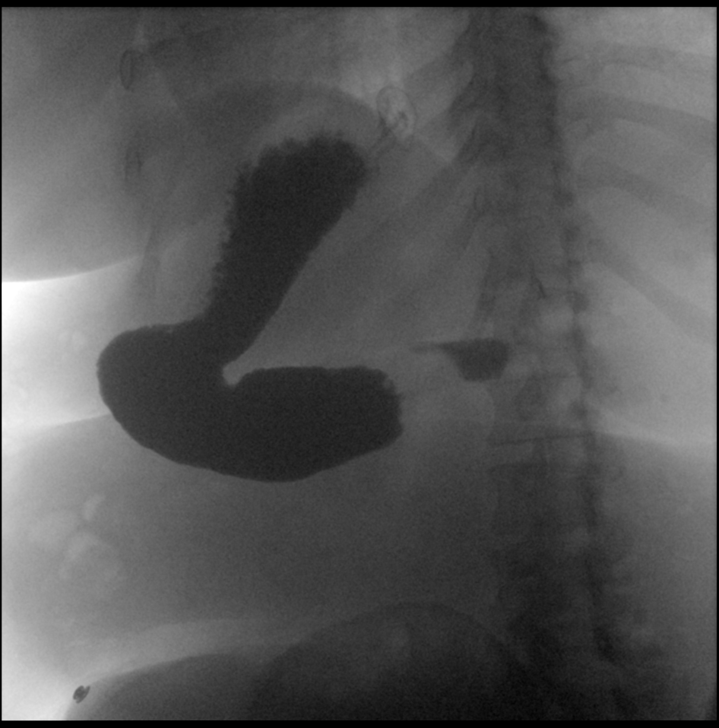

[Series 6: cp_standard · 0.18mm/px · 1 of 1 slices shown (4 of 8)]
[im 1/1]
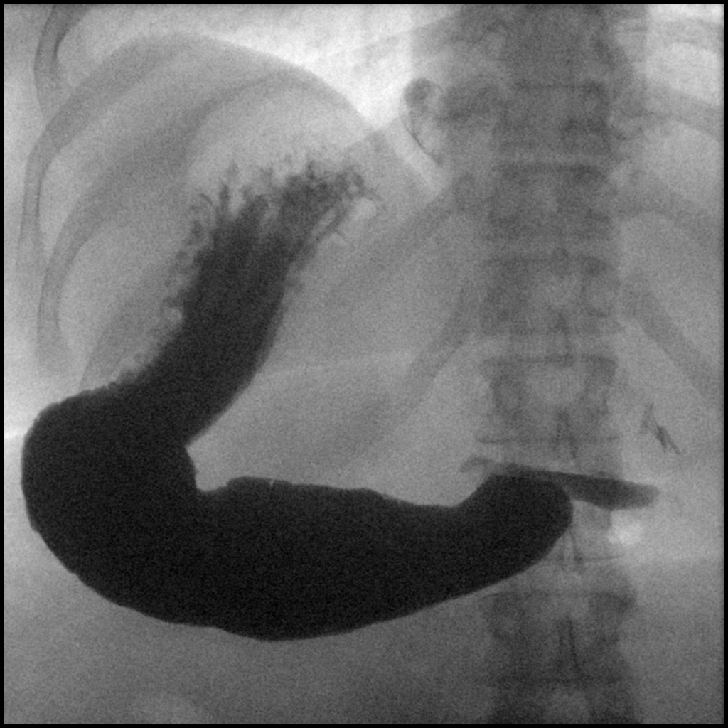

[Series 7: cp_standard · 0.18mm/px · 1 of 1 slices shown (5 of 8)]
[im 1/1]
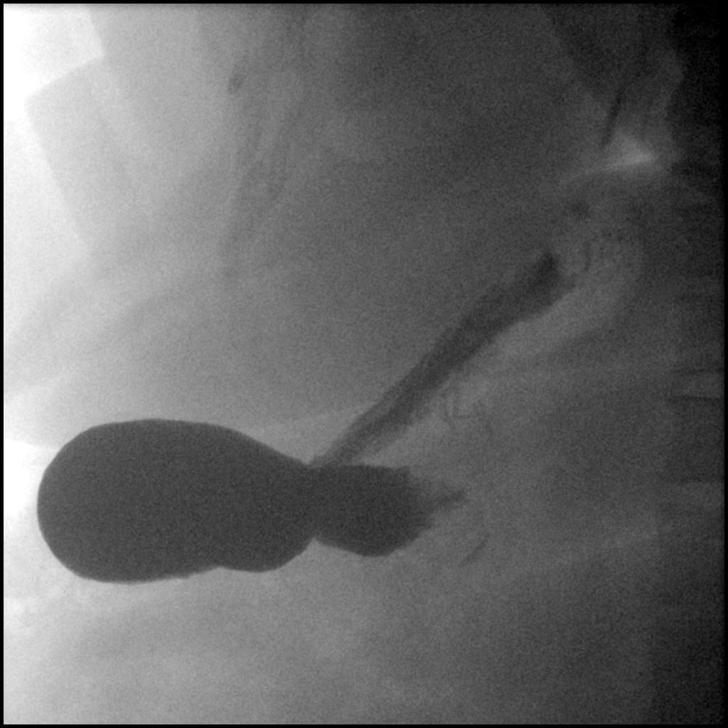

[Series 8: cp_standard · 0.18mm/px · 1 of 1 slices shown (6 of 8)]
[im 1/1]
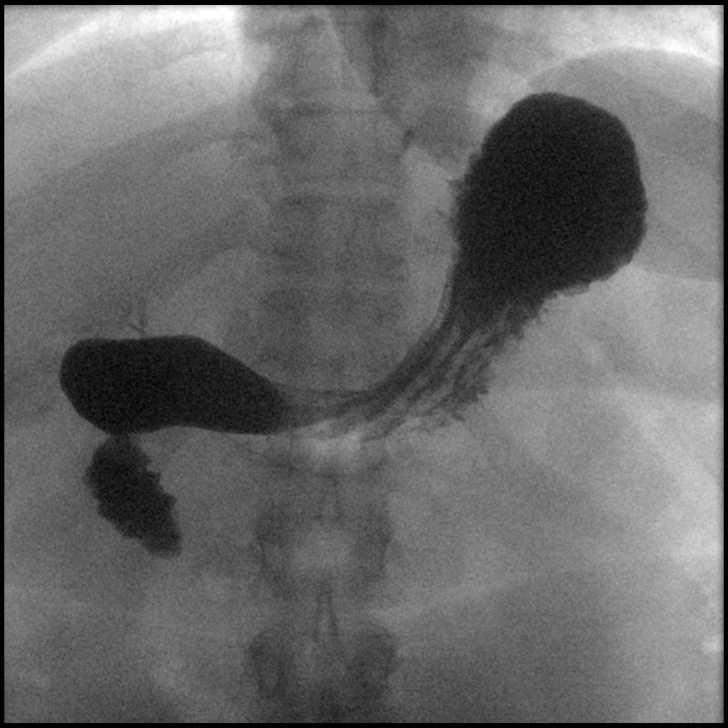

[Series 10: cp_standard · 0.18mm/px · 1 of 1 slices shown (7 of 8)]
[im 1/1]
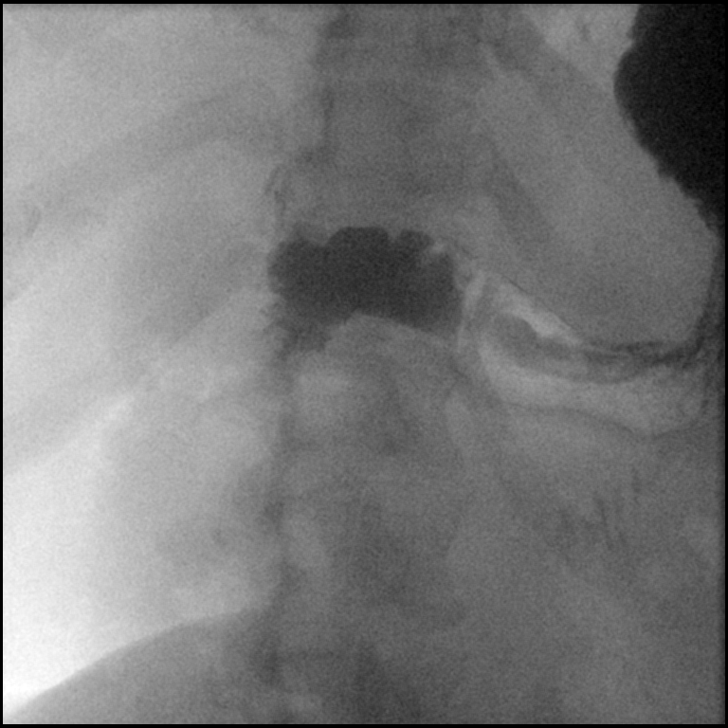

[Series 11: cp_standard · 0.18mm/px · 1 of 1 slices shown (8 of 8)]
[im 1/1]
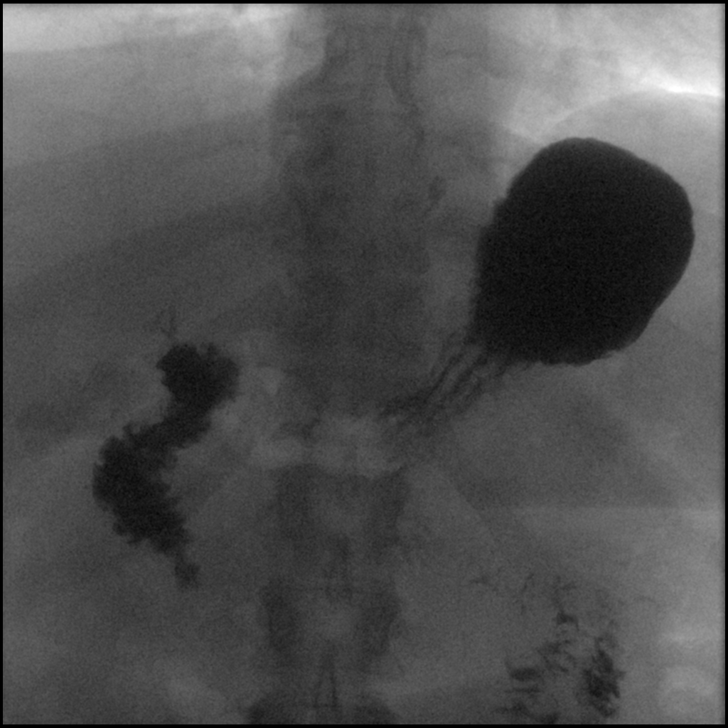

[14 of 17 positions shown; findings below may reference images not displayed]

FINDINGS: Preprocedure scout film demonstrates cholecystectomy clips.
Non-obstructive bowel gas pattern. Suspicion of an interpolar right
renal collecting system 5 mm calculus.

Evaluation of primary peristalsis demonstrates a normal primary
peristaltic wave on each swallow.

Full column evaluation of the esophagus demonstrates no persistent
narrowing or stricture.

Single-contrast evaluation of the stomach, duodenal bulb, and
duodenal C-loop are normal.
IMPRESSION: 1. Normal upper GI.
2. Probable right nephrolithiasis.

## 2022-01-12 ENCOUNTER — Encounter (HOSPITAL_COMMUNITY): Payer: Self-pay | Admitting: *Deleted

## 2022-02-15 ENCOUNTER — Ambulatory Visit
Admission: RE | Admit: 2022-02-15 | Discharge: 2022-02-15 | Disposition: A | Discharge status: Home / Self Care | Payer: No Typology Code available for payment source | Source: Ambulatory Visit | Attending: Nurse Practitioner | Admitting: Nurse Practitioner

## 2022-02-15 ENCOUNTER — Other Ambulatory Visit: Payer: Self-pay | Admitting: Nurse Practitioner

## 2022-02-15 DIAGNOSIS — M25511 Pain in right shoulder: Secondary | ICD-10-CM

## 2022-05-10 ENCOUNTER — Other Ambulatory Visit: Payer: Self-pay | Admitting: Family Medicine

## 2022-05-10 ENCOUNTER — Ambulatory Visit
Admission: RE | Admit: 2022-05-10 | Discharge: 2022-05-10 | Disposition: A | Discharge status: Home / Self Care | Payer: 59 | Source: Ambulatory Visit | Attending: Family Medicine | Admitting: Family Medicine

## 2022-05-10 DIAGNOSIS — J159 Unspecified bacterial pneumonia: Secondary | ICD-10-CM

## 2024-01-10 ENCOUNTER — Encounter (HOSPITAL_COMMUNITY): Payer: Self-pay | Admitting: *Deleted
